# Patient Record
Sex: Male | Born: 1960 | Race: White | Hispanic: No | Marital: Married | Smoking: Former smoker
Health system: Southern US, Community
[De-identification: ages and names within clinical notes are randomized; demographics above are authoritative.]

## PROBLEM LIST (undated history)

## (undated) DIAGNOSIS — I10 Essential (primary) hypertension: Secondary | ICD-10-CM

## (undated) DIAGNOSIS — R972 Elevated prostate specific antigen [PSA]: Secondary | ICD-10-CM

## (undated) DIAGNOSIS — C801 Malignant (primary) neoplasm, unspecified: Secondary | ICD-10-CM

## (undated) DIAGNOSIS — E785 Hyperlipidemia, unspecified: Secondary | ICD-10-CM

## (undated) DIAGNOSIS — K219 Gastro-esophageal reflux disease without esophagitis: Secondary | ICD-10-CM

## (undated) DIAGNOSIS — F1010 Alcohol abuse, uncomplicated: Secondary | ICD-10-CM

## (undated) DIAGNOSIS — Z87442 Personal history of urinary calculi: Secondary | ICD-10-CM

## (undated) DIAGNOSIS — R0600 Dyspnea, unspecified: Secondary | ICD-10-CM

## (undated) HISTORY — DX: Essential (primary) hypertension: I10

## (undated) HISTORY — DX: Elevated prostate specific antigen (PSA): R97.20

## (undated) HISTORY — DX: Hyperlipidemia, unspecified: E78.5

## (undated) HISTORY — DX: Personal history of urinary calculi: Z87.442

---

## 2006-05-04 ENCOUNTER — Emergency Department: Payer: Self-pay | Admitting: Emergency Medicine

## 2009-12-17 ENCOUNTER — Ambulatory Visit: Payer: Self-pay | Admitting: Gastroenterology

## 2012-02-06 ENCOUNTER — Ambulatory Visit: Payer: Self-pay | Admitting: Emergency Medicine

## 2013-09-05 DIAGNOSIS — R972 Elevated prostate specific antigen [PSA]: Secondary | ICD-10-CM | POA: Insufficient documentation

## 2013-09-05 DIAGNOSIS — E785 Hyperlipidemia, unspecified: Secondary | ICD-10-CM | POA: Insufficient documentation

## 2014-02-03 ENCOUNTER — Encounter: Payer: Self-pay | Admitting: Podiatry

## 2014-02-03 ENCOUNTER — Ambulatory Visit (INDEPENDENT_AMBULATORY_CARE_PROVIDER_SITE_OTHER): Payer: BC Managed Care – PPO

## 2014-02-03 ENCOUNTER — Ambulatory Visit (INDEPENDENT_AMBULATORY_CARE_PROVIDER_SITE_OTHER): Payer: BC Managed Care – PPO | Admitting: Podiatry

## 2014-02-03 VITALS — BP 137/86 | HR 75 | Resp 16 | Ht 66.0 in | Wt 210.0 lb

## 2014-02-03 DIAGNOSIS — B351 Tinea unguium: Secondary | ICD-10-CM

## 2014-02-03 DIAGNOSIS — M722 Plantar fascial fibromatosis: Secondary | ICD-10-CM

## 2014-02-03 MED ORDER — DICLOFENAC SODIUM 75 MG PO TBEC: 75.0000 mg | DELAYED_RELEASE_TABLET | Freq: Two times a day (BID) | ORAL | Status: DC

## 2014-02-03 MED ORDER — TRIAMCINOLONE ACETONIDE 10 MG/ML IJ SUSP
10.0000 mg | Freq: Once | INTRAMUSCULAR | Status: AC
  Administered 2014-02-03: 10 mg

## 2014-02-03 NOTE — Progress Notes (Signed)
   Subjective:    Patient ID: Ray Saunders, male    DOB: [birth year], 53 y.o.   MRN: [id number]  HPI Comments: My left heel hurts. Its been hurting for 2 months. It has remained the same. It hurts when i first start walking on it. i dont do anything for my foot.  Foot Pain      Review of Systems  All other systems reviewed and are negative.      Objective:   Physical Exam        Assessment & Plan:

## 2014-02-03 NOTE — Patient Instructions (Signed)

## 2014-02-04 NOTE — Progress Notes (Signed)
Subjective:     Patient ID: Ray Saunders, male   DOB: 03-04-1960, 53 y.o.   MRN: [id number]  HPI patient presents stating I am having pain in my left heel for the last several months and it seems to be worse when I get up or after sitting and now is starting to hurt me all the time   Review of Systems  All other systems reviewed and are negative.      Objective:   Physical Exam  Constitutional: He is oriented to person, place, and time.  Cardiovascular: Intact distal pulses.   Musculoskeletal: Normal range of motion.  Neurological: He is oriented to person, place, and time.  Skin: Skin is warm.  Nursing note and vitals reviewed.  neurovascular status intact with muscle strength adequate and range of motion subtalar and midtarsal joint within normal limits. Patient is noted to have pain in the plantar heel left at the insertional point of the tendon into the calcaneus with fluid buildup and is noted to have good digital perfusion and is well oriented 3     Assessment:     Plantar fasciitis left at the insertional point of the tendon into the calcaneus with fluid buildup noted    Plan:     H&P and x-rays reviewed and today I injected the plantar heel 3 mg Kenalog 5 mg Xylocaine and applied fascial brace with instructions on usage. Placed on diclofenac 75 mg twice a day gave instructions on physical therapy and shoe gear modifications and reappoint in 10 days

## 2014-02-14 ENCOUNTER — Ambulatory Visit (INDEPENDENT_AMBULATORY_CARE_PROVIDER_SITE_OTHER): Payer: BC Managed Care – PPO | Admitting: Podiatry

## 2014-02-14 ENCOUNTER — Encounter: Payer: Self-pay | Admitting: Podiatry

## 2014-02-14 VITALS — BP 132/98 | HR 76 | Resp 18

## 2014-02-14 DIAGNOSIS — M722 Plantar fascial fibromatosis: Secondary | ICD-10-CM

## 2014-02-15 NOTE — Progress Notes (Signed)
Subjective:     Patient ID: LANGDON CROSSON, male   DOB: Feb 16, 1961, 53 y.o.   MRN: [id number]  HPI patient states I'm having quite a bit of reduce discomfort in my plantar fascia with pain still upon deep palpation but for the most part it has improved quite a bit   Review of Systems     Objective:   Physical Exam Neurovascular status intact with muscle strength adequate and noted to have significant diminishment of plantar pain with reduction of the arch noted upon weightbearing and moderate equinus condition    Assessment:     Plantar fasciitis improved with depression of the arch noted and biomechanical dysfunction    Plan:     Reviewed condition and today I advised on physical therapy supportive shoe and I did scan for custom orthotics to reduce stress against the plantar fascia. Patient will be seen back when these are ready

## 2014-03-10 ENCOUNTER — Ambulatory Visit (INDEPENDENT_AMBULATORY_CARE_PROVIDER_SITE_OTHER): Payer: BLUE CROSS/BLUE SHIELD | Admitting: *Deleted

## 2014-03-10 DIAGNOSIS — M722 Plantar fascial fibromatosis: Secondary | ICD-10-CM

## 2014-03-10 NOTE — Progress Notes (Signed)
Orthotics dispensed. Instructions given. Recheck progress in 1 mo.

## 2014-03-10 NOTE — Patient Instructions (Signed)

## 2014-04-11 ENCOUNTER — Ambulatory Visit: Payer: BLUE CROSS/BLUE SHIELD

## 2014-08-21 ENCOUNTER — Ambulatory Visit: Payer: Self-pay

## 2014-08-24 ENCOUNTER — Ambulatory Visit (INDEPENDENT_AMBULATORY_CARE_PROVIDER_SITE_OTHER): Payer: BLUE CROSS/BLUE SHIELD | Admitting: Urology

## 2014-08-24 VITALS — BP 170/92 | HR 82 | Resp 18 | Ht 65.0 in | Wt 221.2 lb

## 2014-08-24 DIAGNOSIS — I10 Essential (primary) hypertension: Secondary | ICD-10-CM | POA: Insufficient documentation

## 2014-08-24 DIAGNOSIS — R972 Elevated prostate specific antigen [PSA]: Secondary | ICD-10-CM

## 2014-08-24 LAB — BLADDER SCAN AMB NON-IMAGING

## 2014-08-24 NOTE — Addendum Note (Signed)
Addended by: Bettye Boeck on: 08/24/2014 04:49 PM   Modules accepted: Orders

## 2014-08-24 NOTE — Progress Notes (Signed)
I have been asked to see the patient by  [facility] family practice, for evaluation and management of elevated PSA.  History of present illness: Patient was found to have an elevated PSA (rapid rise) which was drawn as part of a prostate cancer screening.  He has no family history of prostate cancer.  The patient denies any bone pain, new back pain, or lower extremity edema.  The patient denies any changes in his voiding symptoms over the last 6 months.  Specifically he denies dysuria or hematuria. The patient has had elevated PSAs in the past and been treated with antibody is which has made his PSA returned back to normal.  PSA History: 1.25-> 2.27 (10 months)  IPSS: 6, QO L2 SHIM: Patient is able to get and maintain erections through the completion of intercourse.   Review of systems: A 12 point comprehensive review of systems was obtained and is negative unless otherwise stated in the history of present illness.  There are no active problems to display for this patient.   Current Outpatient Prescriptions on File Prior to Visit  Medication Sig Dispense Refill  . diclofenac (VOLTAREN) 75 MG EC tablet Take 1 tablet (75 mg total) by mouth 2 (two) times daily. 50 tablet 2   No current facility-administered medications on file prior to visit.    No past medical history on file.  No past surgical history on file.  History  Substance Use Topics  . Smoking status: Never Smoker   . Smokeless tobacco: Not on file  . Alcohol Use: No    No family history on file.  PE: There were no vitals filed for this visit. Patient appears to be in no acute distress  patient is alert and oriented x3 Atraumatic normocephalic head No cervical or supraclavicular lymphadenopathy appreciated No increased work of breathing, no audible wheezes/rhonchi Regular sinus rhythm/rate Abdomen is soft, nontender, nondistended, no CVA or suprapubic tenderness Rectal exam reveals a +1 prostate which is smooth,  symmetric without nodularity Lower extremities are symmetric without appreciable edema Grossly neurologically intact No identifiable skin lesions  No results for input(s): WBC, HGB, HCT in the last 72 hours. No results for input(s): NA, K, CL, CO2, GLUCOSE, BUN, CREATININE, CALCIUM in the last 72 hours. No results for input(s): LABPT, INR in the last 72 hours. No results for input(s): LABURIN in the last 72 hours. Results for orders placed or performed in visit on 02/06/12  Influenza A&B Antigens [facility])     Status: None   Collection Time: 02/06/12  8:33 AM  Result Value Ref Range Status   Micro Text Report   Final       COMMENT                   POSITIVE FOR INFLUENZA A (ANTIGEN PRESENT)   COMMENT                   NEGATIVE FOR INFLUENZA B (ANTIGEN ABSENT)   ANTIBIOTIC                                                        Imaging: none  Imp: The patient has a mildly elevated PSA with a rapid rise over the past 10 months. Recommendations: I discussed the implications of an elevated PSA with the patient. However  the reasons for elevated PSA including an enlarged prostate, recent ejaculation, prostate trauma, and infection/inflammation. The patient's PSA today is only slightly elevated, as such, recommended that we repeat the PSA in 6 months.  Cc: [facility] [place], [place]

## 2014-08-25 LAB — URINALYSIS, COMPLETE
Bilirubin, UA: NEGATIVE
Glucose, UA: NEGATIVE
Ketones, UA: NEGATIVE
Leukocytes, UA: NEGATIVE
Nitrite, UA: NEGATIVE
Protein, UA: NEGATIVE
RBC, UA: NEGATIVE
Specific Gravity, UA: 1.025 (ref 1.005–1.030)
Urobilinogen, Ur: 0.2 mg/dL (ref 0.2–1.0)
pH, UA: 6 (ref 5.0–7.5)

## 2014-08-25 LAB — MICROSCOPIC EXAMINATION: Bacteria, UA: NONE SEEN

## 2014-10-06 ENCOUNTER — Encounter: Payer: Self-pay | Admitting: *Deleted

## 2014-10-09 ENCOUNTER — Ambulatory Visit: Payer: BLUE CROSS/BLUE SHIELD | Admitting: Anesthesiology

## 2014-10-09 ENCOUNTER — Encounter
Admission: RE | Disposition: A | Discharge status: Home / Self Care | Payer: Self-pay | Source: Ambulatory Visit | Attending: Gastroenterology

## 2014-10-09 ENCOUNTER — Ambulatory Visit
Admission: RE | Admit: 2014-10-09 | Discharge: 2014-10-09 | Disposition: A | Discharge status: Home / Self Care | Payer: BLUE CROSS/BLUE SHIELD | Source: Ambulatory Visit | Attending: Gastroenterology | Admitting: Gastroenterology

## 2014-10-09 ENCOUNTER — Encounter: Payer: Self-pay | Admitting: *Deleted

## 2014-10-09 DIAGNOSIS — Z87891 Personal history of nicotine dependence: Secondary | ICD-10-CM | POA: Insufficient documentation

## 2014-10-09 DIAGNOSIS — I10 Essential (primary) hypertension: Secondary | ICD-10-CM | POA: Insufficient documentation

## 2014-10-09 DIAGNOSIS — D120 Benign neoplasm of cecum: Secondary | ICD-10-CM | POA: Insufficient documentation

## 2014-10-09 DIAGNOSIS — K219 Gastro-esophageal reflux disease without esophagitis: Secondary | ICD-10-CM | POA: Insufficient documentation

## 2014-10-09 DIAGNOSIS — D123 Benign neoplasm of transverse colon: Secondary | ICD-10-CM | POA: Insufficient documentation

## 2014-10-09 DIAGNOSIS — Z86010 Personal history of colonic polyps: Secondary | ICD-10-CM | POA: Insufficient documentation

## 2014-10-09 HISTORY — DX: Gastro-esophageal reflux disease without esophagitis: K21.9

## 2014-10-09 HISTORY — PX: COLONOSCOPY WITH PROPOFOL: SHX5780

## 2014-10-09 HISTORY — DX: Alcohol abuse, uncomplicated: F10.10

## 2014-10-09 SURGERY — COLONOSCOPY WITH PROPOFOL
Anesthesia: General

## 2014-10-09 MED ORDER — SODIUM CHLORIDE 0.9 % IV SOLN
INTRAVENOUS | Status: DC
  Administered 2014-10-09 – 2014-10-10 (×2): via INTRAVENOUS

## 2014-10-09 MED ORDER — LIDOCAINE HCL (CARDIAC) 20 MG/ML IV SOLN
INTRAVENOUS | Status: DC | PRN
  Administered 2014-10-09: 30 mg via INTRAVENOUS

## 2014-10-09 MED ORDER — PROPOFOL INFUSION 10 MG/ML OPTIME
INTRAVENOUS | Status: DC | PRN
  Administered 2014-10-09: 140 ug/kg/min via INTRAVENOUS

## 2014-10-09 MED ORDER — PROPOFOL 10 MG/ML IV BOLUS
INTRAVENOUS | Status: DC | PRN
  Administered 2014-10-09: 50 mg via INTRAVENOUS

## 2014-10-09 MED ORDER — SODIUM CHLORIDE 0.9 % IV SOLN: INTRAVENOUS | Status: DC

## 2014-10-09 MED ORDER — MIDAZOLAM HCL 5 MG/5ML IJ SOLN
INTRAMUSCULAR | Status: DC | PRN
  Administered 2014-10-09: 1 mg via INTRAVENOUS

## 2014-10-09 MED ORDER — FENTANYL CITRATE (PF) 100 MCG/2ML IJ SOLN
INTRAMUSCULAR | Status: DC | PRN
  Administered 2014-10-09: 50 ug via INTRAVENOUS

## 2014-10-09 NOTE — Op Note (Signed)
[facility] Gastroenterology Patient Name: Ray Saunders Procedure Date: 10/09/2014 11:04 AM MRN: [id number] Account #: [id number] Date of Birth: [birth year] Admit Type: Outpatient Age: 54 Room: [facility] ENDO ROOM 3 Gender: Male Note Status: Finalized Procedure:         Colonoscopy Indications:       Personal history of colonic polyps Providers:         Lollie Sails, MD Referring MD:      Sofie Hartigan (Referring MD) Medicines:         Monitored Anesthesia Care Complications:     No immediate complications. Procedure:         Pre-Anesthesia Assessment:                    - ASA Grade Assessment: III - A patient with severe                     systemic disease.                    After obtaining informed consent, the colonoscope was                     passed under direct vision. Throughout the procedure, the                     patient's blood pressure, pulse, and oxygen saturations                     were monitored continuously. The Colonoscope was                     introduced through the anus and advanced to the the cecum,                     identified by appendiceal orifice and ileocecal valve. The                     colonoscopy was performed without difficulty. The patient                     tolerated the procedure well. The quality of the bowel                     preparation was good. Findings:      A 2 mm polyp was found in the cecum. The polyp was sessile. The polyp       was removed with a cold biopsy forceps. Resection and retrieval were       complete.      A 1 mm polyp was found in the proximal transverse colon. The polyp was       sessile. The polyp was removed with a cold biopsy forceps. Resection and       retrieval were complete.      The retroflexed view of the distal rectum and anal verge was normal and       showed no anal or rectal abnormalities.      The exam was otherwise without abnormality.      The digital rectal exam was  normal. Impression:        - One 2 mm polyp in the cecum. Resected and retrieved.                    - One 1 mm polyp in the  proximal transverse colon.                     Resected and retrieved.                    - The distal rectum and anal verge are normal on                     retroflexion view.                    - The examination was otherwise normal. Recommendation:    - Await pathology results.                    - Telephone GI clinic for pathology results in 1 week. Procedure Code(s): --- Professional ---                    [phone], Colonoscopy, flexible; with biopsy, single or                     multiple Diagnosis Code(s): --- Professional ---                    211.3, Benign neoplasm of colon                    V12.72, Personal history of colonic polyps CPT copyright 2014 American Medical Association. All rights reserved. The codes documented in this report are preliminary and upon coder review may  be revised to meet current compliance requirements. Lollie Sails, MD 10/09/2014 11:34:34 AM This report has been signed electronically. Number of Addenda: 0 Note Initiated On: 10/09/2014 11:04 AM Scope Withdrawal Time: 0 hours 9 minutes 50 seconds  Total Procedure Duration: 0 hours 19 minutes 14 seconds       [facility]

## 2014-10-09 NOTE — Anesthesia Postprocedure Evaluation (Signed)
  Anesthesia Post-op Note  Patient: Ray Saunders  Procedure(s) Performed: Procedure(s): COLONOSCOPY WITH PROPOFOL (N/A)  Anesthesia type:General  Patient location: PACU  Post pain: Pain level controlled  Post assessment: Post-op Vital signs reviewed, Patient's Cardiovascular Status Stable, Respiratory Function Stable, Patent Airway and No signs of Nausea or vomiting  Post vital signs: Reviewed and stable  Last Vitals:  Filed Vitals:   10/09/14 1210  BP: 108/66  Pulse: 67  Temp:   Resp: 13    Level of consciousness: awake, alert  and patient cooperative  Complications: No apparent anesthesia complications

## 2014-10-09 NOTE — H&P (Signed)
Outpatient short stay form Pre-procedure 10/09/2014 10:56 AM Lollie Sails MD  Primary Physician: Dr. deal Feldpausch  Reason for visit:  Colonoscopy  History of present illness:  Patient is a 54 year old male with a personal history of adenomatous colon polyps. His last colonoscopy was in 2011. He tolerated his prep well. Takes no aspirin or anticoagulation medications.    Current facility-administered medications:  .  0.9 %  sodium chloride infusion, , Intravenous, Continuous, Lollie Sails, MD .  0.9 %  sodium chloride infusion, , Intravenous, Continuous, Lollie Sails, MD  Prescriptions prior to admission  Medication Sig Dispense Refill Last Dose  . aspirin EC 81 MG tablet Take 1 tablet by mouth daily.   10/08/2014 at Unknown time  . lisinopril (PRINIVIL,ZESTRIL) 10 MG tablet Take 1 tablet by mouth daily.     [name] lisinopril (PRINIVIL,ZESTRIL) 20 MG tablet Take 20 mg by mouth daily.   10/08/2014 at Unknown time  . diclofenac (VOLTAREN) 75 MG EC tablet Take 1 tablet (75 mg total) by mouth 2 (two) times daily. (Patient not taking: Reported on 08/24/2014) 50 tablet 2 Not Taking     No Known Allergies   Past Medical History  Diagnosis Date  . Hypertension   . Elevated PSA   . Alcohol abuse   . GERD (gastroesophageal reflux disease)   . Elevated PSA     Review of systems:      Physical Exam    Heart and lungs: Regular rate and rhythm without rub or gallop, lungs are bilaterally clear    HEENT: Normocephalic atraumatic eyes are anicteric    Other:     Pertinant exam for procedure: Soft nontender nondistended bowel sounds positive normoactive    Planned proceedures: Colonoscopy and indicated procedures I have discussed the risks benefits and complications of procedures to include not limited to bleeding, infection, perforation and the risk of sedation and the patient wishes to proceed.    Lollie Sails, MD Gastroenterology 10/09/2014  10:56  AM

## 2014-10-09 NOTE — Transfer of Care (Signed)
Immediate Anesthesia Transfer of Care Note  Patient: Ray Saunders  Procedure(s) Performed: Procedure(s): COLONOSCOPY WITH PROPOFOL (N/A)  Patient Location: PACU and Short Stay  Anesthesia Type:General  Level of Consciousness: awake, oriented and patient cooperative  Airway & Oxygen Therapy: Patient Spontanous Breathing and Patient connected to face mask oxygen  Post-op Assessment: Report given to RN and Post -op Vital signs reviewed and stable  Post vital signs: Reviewed and stable  Last Vitals:  Filed Vitals:   10/09/14 1145  BP: 104/65  Pulse: 71  Temp: 36.4 C  Resp:     Complications: No apparent anesthesia complications

## 2014-10-09 NOTE — Anesthesia Preprocedure Evaluation (Signed)
Anesthesia Evaluation  Patient identified by MRN, date of birth, ID band Patient awake    Reviewed: Allergy & Precautions, NPO status , Patient's Chart, lab work & pertinent test results  Airway Mallampati: II       Dental  (+) Partial Upper   Pulmonary neg pulmonary ROS, former smoker,    Pulmonary exam normal       Cardiovascular hypertension, Pt. on medications Normal cardiovascular exam    Neuro/Psych    GI/Hepatic Neg liver ROS, GERD-  ,  Endo/Other  negative endocrine ROS  Renal/GU negative Renal ROS     Musculoskeletal negative musculoskeletal ROS (+)   Abdominal (+) + obese,   Peds negative pediatric ROS (+)  Hematology negative hematology ROS (+)   Anesthesia Other Findings   Reproductive/Obstetrics negative OB ROS                             Anesthesia Physical Anesthesia Plan  ASA: III  Anesthesia Plan: General   Post-op Pain Management:    Induction: Intravenous  Airway Management Planned: Nasal Cannula  Additional Equipment:   Intra-op Plan:   Post-operative Plan:   Informed Consent: I have reviewed the patients History and Physical, chart, labs and discussed the procedure including the risks, benefits and alternatives for the proposed anesthesia with the patient or authorized representative who has indicated his/her understanding and acceptance.     Plan Discussed with: CRNA  Anesthesia Plan Comments:         Anesthesia Quick Evaluation

## 2014-10-10 ENCOUNTER — Encounter: Payer: Self-pay | Admitting: Gastroenterology

## 2014-10-11 LAB — SURGICAL PATHOLOGY

## 2014-10-12 ENCOUNTER — Emergency Department: Payer: BLUE CROSS/BLUE SHIELD

## 2014-10-12 ENCOUNTER — Emergency Department
Admission: EM | Admit: 2014-10-12 | Discharge: 2014-10-12 | Disposition: A | Discharge status: Home / Self Care | Payer: BLUE CROSS/BLUE SHIELD | Attending: Emergency Medicine | Admitting: Emergency Medicine

## 2014-10-12 DIAGNOSIS — I10 Essential (primary) hypertension: Secondary | ICD-10-CM | POA: Insufficient documentation

## 2014-10-12 DIAGNOSIS — R109 Unspecified abdominal pain: Secondary | ICD-10-CM | POA: Diagnosis present

## 2014-10-12 DIAGNOSIS — Z7982 Long term (current) use of aspirin: Secondary | ICD-10-CM | POA: Insufficient documentation

## 2014-10-12 DIAGNOSIS — Z79899 Other long term (current) drug therapy: Secondary | ICD-10-CM | POA: Diagnosis not present

## 2014-10-12 DIAGNOSIS — Z87891 Personal history of nicotine dependence: Secondary | ICD-10-CM | POA: Insufficient documentation

## 2014-10-12 DIAGNOSIS — N201 Calculus of ureter: Secondary | ICD-10-CM | POA: Diagnosis not present

## 2014-10-12 LAB — CBC WITH DIFFERENTIAL/PLATELET
BASOS ABS: 0.1 10*3/uL (ref 0–0.1)
BASOS PCT: 1 %
Eosinophils Absolute: 0.3 10*3/uL (ref 0–0.7)
Eosinophils Relative: 4 %
HEMATOCRIT: 46.3 % (ref 40.0–52.0)
HEMOGLOBIN: 15.6 g/dL (ref 13.0–18.0)
Lymphocytes Relative: 43 %
Lymphs Abs: 3.6 10*3/uL (ref 1.0–3.6)
MCH: 30 pg (ref 26.0–34.0)
MCHC: 33.7 g/dL (ref 32.0–36.0)
MCV: 88.8 fL (ref 80.0–100.0)
Monocytes Absolute: 0.8 10*3/uL (ref 0.2–1.0)
Monocytes Relative: 9 %
NEUTROS ABS: 3.6 10*3/uL (ref 1.4–6.5)
NEUTROS PCT: 43 %
Platelets: 231 10*3/uL (ref 150–440)
RBC: 5.21 MIL/uL (ref 4.40–5.90)
RDW: 13.5 % (ref 11.5–14.5)
WBC: 8.4 10*3/uL (ref 3.8–10.6)

## 2014-10-12 LAB — URINALYSIS COMPLETE WITH MICROSCOPIC (ARMC ONLY)
Bilirubin Urine: NEGATIVE
Glucose, UA: NEGATIVE mg/dL
HGB URINE DIPSTICK: NEGATIVE
KETONES UR: NEGATIVE mg/dL
LEUKOCYTES UA: NEGATIVE
NITRITE: NEGATIVE
PROTEIN: NEGATIVE mg/dL
SPECIFIC GRAVITY, URINE: 1.024 (ref 1.005–1.030)
pH: 6 (ref 5.0–8.0)

## 2014-10-12 LAB — COMPREHENSIVE METABOLIC PANEL
ALK PHOS: 58 U/L (ref 38–126)
ALT: 48 U/L (ref 17–63)
ANION GAP: 8 (ref 5–15)
AST: 37 U/L (ref 15–41)
Albumin: 4.4 g/dL (ref 3.5–5.0)
BILIRUBIN TOTAL: 0.6 mg/dL (ref 0.3–1.2)
BUN: 15 mg/dL (ref 6–20)
CO2: 24 mmol/L (ref 22–32)
Calcium: 9.3 mg/dL (ref 8.9–10.3)
Chloride: 110 mmol/L (ref 101–111)
Creatinine, Ser: 1.37 mg/dL — ABNORMAL HIGH (ref 0.61–1.24)
GFR, EST NON AFRICAN AMERICAN: 57 mL/min — AB (ref >60)
Glucose, Bld: 123 mg/dL — ABNORMAL HIGH (ref 65–99)
Potassium: 3.5 mmol/L (ref 3.5–5.1)
SODIUM: 142 mmol/L (ref 135–145)
TOTAL PROTEIN: 7.5 g/dL (ref 6.5–8.1)

## 2014-10-12 MED ORDER — HYDROMORPHONE HCL 1 MG/ML IJ SOLN
1.0000 mg | Freq: Once | INTRAMUSCULAR | Status: AC
  Administered 2014-10-12: 1 mg via INTRAVENOUS
  Filled 2014-10-12: qty 1

## 2014-10-12 MED ORDER — OXYCODONE-ACETAMINOPHEN 5-325 MG PO TABS: 1.0000 | ORAL_TABLET | Freq: Four times a day (QID) | ORAL | Status: DC | PRN

## 2014-10-12 MED ORDER — TAMSULOSIN HCL 0.4 MG PO CAPS: 0.4000 mg | ORAL_CAPSULE | Freq: Every day | ORAL | Status: DC

## 2014-10-12 MED ORDER — ONDANSETRON HCL 4 MG/2ML IJ SOLN
4.0000 mg | Freq: Once | INTRAMUSCULAR | Status: AC
  Administered 2014-10-12: 4 mg via INTRAVENOUS
  Filled 2014-10-12: qty 2

## 2014-10-12 MED ORDER — KETOROLAC TROMETHAMINE 30 MG/ML IJ SOLN
30.0000 mg | Freq: Once | INTRAMUSCULAR | Status: AC
  Administered 2014-10-12: 30 mg via INTRAVENOUS
  Filled 2014-10-12: qty 1

## 2014-10-12 MED ORDER — SODIUM CHLORIDE 0.9 % IV BOLUS (SEPSIS)
1000.0000 mL | Freq: Once | INTRAVENOUS | Status: AC
  Administered 2014-10-12: 1000 mL via INTRAVENOUS

## 2014-10-12 NOTE — ED Notes (Signed)
Patient transported to CT 

## 2014-10-12 NOTE — ED Notes (Signed)
Pt still diaphoric still hurting iv pain meds didn't help MD aware. Pt still having dry heaves.

## 2014-10-12 NOTE — ED Provider Notes (Addendum)
[facility] Emergency Department Provider Note  Time seen: 5:18 AM  I have reviewed the triage vital signs and the nursing notes.   HISTORY  Chief Complaint Flank Pain    HPI Ray Saunders is a 54 y.o. male with a past medical history of hypertension presents the emergency department left flank pain. According to the patient he woke up to urinate. Upon urinating he developed sharp severe left flank pain. Patient states the pain is a 10/10. Has a history of one kidney stone approximately 6 years ago. Denies fever, dysuria, diarrhea, vomiting, but does state nausea. Denies chest pain, trouble breathing.No modifying factors identified.     Past Medical History  Diagnosis Date  . Hypertension   . Elevated PSA   . Alcohol abuse   . GERD (gastroesophageal reflux disease)   . Elevated PSA     Patient Active Problem List   Diagnosis Date Noted  . BP (high blood pressure) 08/24/2014  . Abnormal prostate specific antigen 09/05/2013  . HLD (hyperlipidemia) 09/05/2013    Past Surgical History  Procedure Laterality Date  . No surgical history    . Colonoscopy with propofol N/A 10/09/2014    Procedure: COLONOSCOPY WITH PROPOFOL;  Surgeon: Lollie Sails, MD;  Location: [facility] ENDOSCOPY;  Service: Endoscopy;  Laterality: N/A;    Current Outpatient Rx  Name  Route  Sig  Dispense  Refill  . aspirin EC 81 MG tablet   Oral   Take 1 tablet by mouth daily.         [name] lisinopril (PRINIVIL,ZESTRIL) 20 MG tablet   Oral   Take 20 mg by mouth daily.         . diclofenac (VOLTAREN) 75 MG EC tablet   Oral   Take 1 tablet (75 mg total) by mouth 2 (two) times daily. Patient not taking: Reported on 08/24/2014   50 tablet   2     Allergies Review of patient's allergies indicates no known allergies.  Family History  Problem Relation Age of Onset  . Kidney cancer Other   . Kidney cancer Mother     Social History Social History  Substance Use Topics   . Smoking status: Former Smoker    Types: Cigarettes    Quit date: 08/23/1989  . Smokeless tobacco: Not on file  . Alcohol Use: No    Review of Systems Constitutional: Negative for fever. Cardiovascular: Negative for chest pain. Respiratory: Negative for shortness of breath. Gastrointestinal: Positive for left flank pain and nausea. Negative for vomiting or diarrhea. Genitourinary: Negative for dysuria. Negative for hematuria. Musculoskeletal: Positive for left back pain. 10-point ROS otherwise negative.  ____________________________________________   PHYSICAL EXAM:  VITAL SIGNS: ED Triage Vitals  Enc Vitals Group     BP 10/12/14 0508 154/96 mmHg     Pulse Rate 10/12/14 0507 73     Resp 10/12/14 0507 18     Temp 10/12/14 0507 98.3 F (36.8 C)     Temp Source 10/12/14 0507 Oral     SpO2 10/12/14 0507 94 %     Weight 10/12/14 0507 210 lb (95.255 kg)     Height 10/12/14 0507 5\' 6"  (1.676 m)     Head Cir --      Peak Flow --      Pain Score 10/12/14 0508 10     Pain Loc --      Pain Edu? --      Excl. in [place]? --  Constitutional: Alert and oriented. Well appearing and in no distress. Eyes: Normal exam ENT   Head: Normocephalic and atraumatic Cardiovascular: Normal rate, regular rhythm. No murmurs, rubs, or gallops. Respiratory: Normal respiratory effort without tachypnea nor retractions. Breath sounds are clear Gastrointestinal: Soft and nontender. No distention.  There is no CVA tenderness. Musculoskeletal: Nontender with normal range of motion in all extremities.  Neurologic:  Normal speech and language. No gross focal neurologic deficits  Skin:  Skin is warm, dry and intact.  Psychiatric: Mood and affect are normal. Speech and behavior are normal. ____________________________________________   RADIOLOGY  CT consistent with ureterolithiasis.  ____________________________________________   INITIAL IMPRESSION / ASSESSMENT AND PLAN / ED  COURSE  Pertinent labs & imaging results that were available during my care of the patient were reviewed by me and considered in my medical decision making (see chart for details).  Patient with acute onset of left flank pain. We'll check labs, CT renal scan, and treat pain and nausea. Exam most consistent with ureterolithiasis.  CT consistent with ureterolithiasis with 2 stones, one of which measuring 6 mm. I discussed with the patient the need to follow-up with urology, he will call the number provided to make an appointment as soon as possible. We will continue to monitor the patient in the emergency department, currently the patient states his pain is a 2/10 down from a 10/10. Patient care signed out to Dr. Edd Fabian. Discussed incidental pulmonary nodule finding with patient and the need to follow up for CT scan in 1 year. Patient agreeable. ____________________________________________   FINAL CLINICAL IMPRESSION(S) / ED DIAGNOSES  Ureterolithiasis Left flank pain   Harvest Dark, MD 10/12/14 [id number]  Harvest Dark, MD 10/12/14 0700

## 2014-10-12 NOTE — ED Notes (Signed)
MD at bedside. 

## 2014-10-12 NOTE — ED Notes (Signed)
Pt in with co left flank pian hx of kidney  Stones.

## 2014-10-12 NOTE — ED Notes (Addendum)
Pt c/o of left flank pain radiates to left testicle. Pain 10/10 pt is clammy and diaphoric. Severe nausea and dry heaves.

## 2014-10-12 NOTE — Discharge Instructions (Signed)

## 2014-10-17 ENCOUNTER — Ambulatory Visit (INDEPENDENT_AMBULATORY_CARE_PROVIDER_SITE_OTHER): Payer: BLUE CROSS/BLUE SHIELD | Admitting: Urology

## 2014-10-17 ENCOUNTER — Encounter: Payer: Self-pay | Admitting: Urology

## 2014-10-17 VITALS — BP 135/80 | HR 66 | Ht 66.0 in | Wt 215.5 lb

## 2014-10-17 DIAGNOSIS — N132 Hydronephrosis with renal and ureteral calculous obstruction: Secondary | ICD-10-CM

## 2014-10-17 DIAGNOSIS — R972 Elevated prostate specific antigen [PSA]: Secondary | ICD-10-CM

## 2014-10-17 DIAGNOSIS — N201 Calculus of ureter: Secondary | ICD-10-CM

## 2014-10-17 DIAGNOSIS — R312 Other microscopic hematuria: Secondary | ICD-10-CM | POA: Diagnosis not present

## 2014-10-17 DIAGNOSIS — R3129 Other microscopic hematuria: Secondary | ICD-10-CM | POA: Insufficient documentation

## 2014-10-17 LAB — URINALYSIS, COMPLETE
Bilirubin, UA: NEGATIVE
GLUCOSE, UA: NEGATIVE
KETONES UA: NEGATIVE
Nitrite, UA: NEGATIVE
Urobilinogen, Ur: 0.2 mg/dL (ref 0.2–1.0)
pH, UA: 5 (ref 5.0–7.5)

## 2014-10-17 LAB — MICROSCOPIC EXAMINATION: WBC, UA: >30 /hpf — ABNORMAL HIGH (ref 0–?)

## 2014-10-17 NOTE — Progress Notes (Signed)
10/17/2014 8:47 AM   Ray Saunders 1960-06-18 [id number]  Referring provider: Sofie Hartigan, MD [place], [place] [address]  Chief Complaint  Patient presents with  . Nephrolithiasis    patient seen in ER    HPI: Ray Saunders is a 54 -year-old white male with a history of nephrolithiasis who presents today after being seen in the emergency room on August 18 for intense left-sided flank pain.   Noncontrast CT performed at that time noted 2 separate distal ureteral stones, the largest measuring 6 mm. He was discharged with oxycodone/APAP and tamsulosin and instructed to follow-up with urology.  Over the next several days, he experienced intense left-sided flank pain on and off. He described the pain as feeling like a knife stabbing him in the back. The pain will last for 45 minutes to an hour. He would soak in a hot tub of water to help ease the pain. He did not find anything that made the pain worse. He denied any fevers, chills, nausea or vomiting. He did not experience gross hematuria.  He believes he may have passed the stone early this a.m. His pain became increasingly worse throughout the night and then as of this morning he has been pain free. He did not capture any stone fragments noted he see any stone fragments in the toilet.  His UA today is positive for microscopic hematuria with greater than 30 RBC's seen per high-power field.  *side note-patient had recently seen Dr. Louis Meckel for an elevated PSA and is to return in 6 months for follow-up  PMH: Past Medical History  Diagnosis Date  . Hypertension   . Elevated PSA   . Alcohol abuse   . GERD (gastroesophageal reflux disease)   . Elevated PSA   . History of kidney stones     Surgical History: Past Surgical History  Procedure Laterality Date  . Colonoscopy with propofol N/A 10/09/2014    Procedure: COLONOSCOPY WITH PROPOFOL;  Surgeon: Lollie Sails, MD;  Location: [facility] ENDOSCOPY;  Service: Endoscopy;   Laterality: N/A;    Home Medications:    Medication List       This list is accurate as of: 10/17/14  8:47 AM.  Always use your most recent med list.               aspirin EC 81 MG tablet  Take 1 tablet by mouth daily.     diclofenac 75 MG EC tablet  Commonly known as:  VOLTAREN  Take 1 tablet (75 mg total) by mouth 2 (two) times daily.     lisinopril 20 MG tablet  Commonly known as:  PRINIVIL,ZESTRIL  Take 20 mg by mouth daily.     oxyCODONE-acetaminophen 5-325 MG per tablet  Commonly known as:  ROXICET  Take 1 tablet by mouth every 6 (six) hours as needed.     tamsulosin 0.4 MG Caps capsule  Commonly known as:  FLOMAX  Take 1 capsule (0.4 mg total) by mouth daily.        Allergies: No Known Allergies  Family History: Family History  Problem Relation Age of Onset  . Kidney failure Mother     transplant  . Prostate cancer Neg Hx     Social History:  reports that he quit smoking about 25 years ago. His smoking use included Cigarettes. He does not have any smokeless tobacco history on file. He reports that he drinks alcohol. He reports that he does not use illicit  drugs.  ROS: UROLOGY Frequent Urination?: No Hard to postpone urination?: No Burning/pain with urination?: No Get up at night to urinate?: Yes Leakage of urine?: No Urine stream starts and stops?: Yes Trouble starting stream?: No Do you have to strain to urinate?: No Blood in urine?: No Urinary tract infection?: No Sexually transmitted disease?: No Injury to kidneys or bladder?: No Painful intercourse?: No Weak stream?: Yes Erection problems?: Yes Penile pain?: No  Gastrointestinal Nausea?: No Vomiting?: No Indigestion/heartburn?: No Diarrhea?: No Constipation?: No  Constitutional Fever: No Night sweats?: No Weight loss?: No Fatigue?: Yes  Skin Skin rash/lesions?: No Itching?: No  Eyes Blurred vision?: No Double vision?: No  Ears/Nose/Throat Sore throat?: No Sinus  problems?: No  Hematologic/Lymphatic Swollen glands?: No Easy bruising?: No  Cardiovascular Leg swelling?: No Chest pain?: No  Respiratory Cough?: Yes Shortness of breath?: Yes  Endocrine Excessive thirst?: No  Musculoskeletal Back pain?: Yes Joint pain?: No  Neurological Headaches?: No Dizziness?: No  Psychologic Depression?: No Anxiety?: No  Physical Exam: BP 135/80 mmHg  Pulse 66  Ht 5\' 6"  (1.676 m)  Wt 215 lb 8 oz (97.75 kg)  BMI 34.80 kg/m2   Laboratory Data: Results for orders placed or performed in visit on 10/17/14  Microscopic Examination  Result Value Ref Range   WBC, UA >30 (H) 0 -  5 /hpf   RBC, UA >30 (H) 0 -  2 /hpf   Epithelial Cells (non renal) 0-10 0 - 10 /hpf   Mucus, UA Present (A) Not Estab.   Bacteria, UA Many (A) None seen/Few  Urinalysis, Complete  Result Value Ref Range   Specific Gravity, UA >1.030 (H) 1.005 - 1.030   pH, UA 5.0 5.0 - 7.5   Color, UA Yellow Yellow   Appearance Ur Cloudy (A) Clear   Leukocytes, UA 1+ (A) Negative   Protein, UA 3+ (A) Negative/Trace   Glucose, UA Negative Negative   Ketones, UA Negative Negative   RBC, UA 3+ (A) Negative   Bilirubin, UA Negative Negative   Urobilinogen, Ur 0.2 0.2 - 1.0 mg/dL   Nitrite, UA Negative Negative   Microscopic Examination See below:     Lab Results  Component Value Date   WBC 8.4 10/12/2014   HGB 15.6 10/12/2014   HCT 46.3 10/12/2014   MCV 88.8 10/12/2014   PLT 231 10/12/2014    Lab Results  Component Value Date   CREATININE 1.37* 10/12/2014   PSA History:      1.25 ng/mL on 09/05/2013      3.35 ng/mL on 04/04/2013      2.27 ng/mL on 07/19/2014  Urinalysis    Component Value Date/Time   COLORURINE YELLOW* 10/12/2014 0531   APPEARANCEUR CLEAR* 10/12/2014 0531   LABSPEC 1.024 10/12/2014 0531   PHURINE 6.0 10/12/2014 0531   GLUCOSEU NEGATIVE 10/12/2014 0531   HGBUR NEGATIVE 10/12/2014 0531   BILIRUBINUR NEGATIVE 10/12/2014 0531   BILIRUBINUR  Negative 08/24/2014 [place] 10/12/2014 0531   PROTEINUR NEGATIVE 10/12/2014 0531   NITRITE NEGATIVE 10/12/2014 0531   NITRITE Negative 08/24/2014 1514   LEUKOCYTESUR NEGATIVE 10/12/2014 0531   LEUKOCYTESUR Negative 08/24/2014 1514    Pertinent Imaging: CLINICAL DATA: Left flank pain and history of kidney stones.  EXAM: CT ABDOMEN AND PELVIS WITHOUT CONTRAST  TECHNIQUE: Multidetector CT imaging of the abdomen and pelvis was performed following the standard protocol without IV contrast.  COMPARISON: None currently available  FINDINGS: BODY WALL: No contributory findings.  LOWER CHEST: 5  mm pulmonary nodule in the left lower lobe on image 18. Unfortunately, 05/04/2006 abdominal CT comparison is not available.  Coronary atherosclerotic calcification.  ABDOMEN/PELVIS:  Liver: Patchy hepatic steatosis.  Biliary: No evidence of biliary obstruction or stone.  Pancreas: Unremarkable.  Spleen: Unremarkable.  Adrenals: Unremarkable.  Kidneys and ureters: Mild left hydroureteronephrosis secondary to 2 distal left ureteral calculi measuring 4 mm proximally and 6 by 3 mm distally. Multiple right renal calculi without hydronephrosis or right ureteral calculus.  Bladder: Unremarkable.  Reproductive: Dystrophic calcifications in the prostate gland. Asymmetric appearance of the seminal vesicles is best explained by more vertical orientation of the right vesicle. The seminal vesicles have overall symmetric volume. Bilateral hydrocele, partly visible.  Bowel: No obstruction. No inflammatory changes.  Retroperitoneum: No mass or adenopathy.  Peritoneum: No ascites or pneumoperitoneum.  Vascular: No acute abnormality.  OSSEOUS: No acute abnormalities.  IMPRESSION: 1. Two stones in the distal left ureter with mild obstruction. The calculi measure 4 mm and 6 x 3 mm. 2. Right nephrolithiasis. 3. 5 mm left lower lobe pulmonary nodule.  Unfortunately, a CT from 05/04/2006 is not available for review and a followup noncontrast chest CT in 6 to 12 months is recommended in this patient with smoking history. 4. Hepatic steatosis and coronary atherosclerosis  Electronically Signed: By: Monte Fantasia M.D. On: 10/12/2014 06:05  Assessment & Plan:    1. Left ureteral stones:    Patient was found to have 2 stones in his distal left ureter on noncontrast CT performed on 10/12/2014. He believes his past the stones as his pain had abated early this morning. He did not capture any fragments. I will obtain a KUB and renal ultrasound to evaluate his current stone status.  He will return for report. If stones are not visible hydronephrosis has resolved, we will obtain a 24-hour urine for metabolic workup.  - Urinalysis, Complete   2. Hydronephrosis:    Patient believes he may have passed his to left distal ureteral stones. We will obtain a renal ultrasound to ensure the hydronephrosis has resolved.  3. Microscopic hematuria:   Patient had greater than 30 RBC's per high-power field on today's urinalysis. This is in the presence of ureteral stones. We will continue to monitor his urinalyses to ensure the microscopic hematuria resolves.  4. Rising PSA:     Patient's PSA has risen from 1.25-2.27 and a 10 month time period.   He will return in 6 months time for follow-up DRE and PSA.    No Follow-up on file.  Ray Saunders, [facility] [address], [place], [place] [address] [phone]

## 2014-10-24 ENCOUNTER — Ambulatory Visit: Payer: BLUE CROSS/BLUE SHIELD | Admitting: Obstetrics and Gynecology

## 2015-02-12 ENCOUNTER — Ambulatory Visit (INDEPENDENT_AMBULATORY_CARE_PROVIDER_SITE_OTHER): Payer: BLUE CROSS/BLUE SHIELD | Admitting: Obstetrics and Gynecology

## 2015-02-12 ENCOUNTER — Telehealth: Payer: Self-pay | Admitting: Obstetrics and Gynecology

## 2015-02-12 ENCOUNTER — Ambulatory Visit: Payer: BLUE CROSS/BLUE SHIELD | Admitting: Urology

## 2015-02-12 ENCOUNTER — Encounter: Payer: Self-pay | Admitting: Obstetrics and Gynecology

## 2015-02-12 VITALS — BP 157/89 | HR 77 | Resp 16 | Ht 66.0 in | Wt 221.3 lb

## 2015-02-12 DIAGNOSIS — R972 Elevated prostate specific antigen [PSA]: Secondary | ICD-10-CM

## 2015-02-12 LAB — MICROSCOPIC EXAMINATION
BACTERIA UA: NONE SEEN
EPITHELIAL CELLS (NON RENAL): NONE SEEN /HPF (ref 0–10)

## 2015-02-12 LAB — URINALYSIS, COMPLETE
Bilirubin, UA: NEGATIVE
Glucose, UA: NEGATIVE
KETONES UA: NEGATIVE
LEUKOCYTES UA: NEGATIVE
NITRITE UA: NEGATIVE
PH UA: 7 (ref 5.0–7.5)
Protein, UA: NEGATIVE
RBC UA: NEGATIVE
SPEC GRAV UA: 1.02 (ref 1.005–1.030)
Urobilinogen, Ur: 0.2 mg/dL (ref 0.2–1.0)

## 2015-02-12 NOTE — Telephone Encounter (Signed)
Please notify patient that he just needs to call scheduling at [phone] to schedule his renal ultrasound and KUB. Heaney's to tell them that he needs to have them both done at the same time. I will call him with results. Thanks

## 2015-02-12 NOTE — Progress Notes (Signed)
9:34 AM   Ray Saunders Jul 20, 1960 [id number]  Referring provider: Sofie Hartigan, MD [place], [place] [address]  Chief Complaint  Patient presents with  . Elevated PSA    HPI: Ray Saunders is a 54 -year-old white male with a history of nephrolithiasis who was previously seen in our office for a rising PSA and nephrolithiasis.  Noncontrast CT performed in August noted  2 separate distal ureteral stones, the largest measuring 6 mm. He was discharged with oxycodone/APAP and tamsulosin and instructed to follow-up with urology.  Patient presented to our office for follow-up visit. At that time his pain had resolved and it was believed that he had passed his stones. Renal ultrasound and KUB was ordered for confirmation and the patient did not have these imaging studies performed because he did not feel that they were needed because his pain had completely resolved. Microscopic hematuria was also noted at his last visit. Patient denies any further flank pain. No gross hematuria or fevers.  He presents today for recheck on his previously noted rising PSAs. He reports that he has been feeling well and denies any urinary symptoms.  PMH: Past Medical History  Diagnosis Date  . Hypertension   . Elevated PSA   . Alcohol abuse   . GERD (gastroesophageal reflux disease)   . Elevated PSA   . History of kidney stones   . HLD (hyperlipidemia)     Surgical History: Past Surgical History  Procedure Laterality Date  . Colonoscopy with propofol N/A 10/09/2014    Procedure: COLONOSCOPY WITH PROPOFOL;  Surgeon: Lollie Sails, MD;  Location: [facility] ENDOSCOPY;  Service: Endoscopy;  Laterality: N/A;    Home Medications:    Medication List       This list is accurate as of: 02/12/15  9:34 AM.  Always use your most recent med list.               diclofenac 75 MG EC tablet  Commonly known as:  VOLTAREN  Take 1 tablet (75 mg total) by  mouth 2 (two) times daily.     lisinopril 20 MG tablet  Commonly known as:  PRINIVIL,ZESTRIL  Take 20 mg by mouth daily.        Allergies: No Known Allergies  Family History: Family History  Problem Relation Age of Onset  . Kidney failure Mother     transplant  . Prostate cancer Neg Hx     Social History:  reports that he quit smoking about 25 years ago. His smoking use included Cigarettes. He does not have any smokeless tobacco history on file. He reports that he drinks alcohol. He reports that he does not use illicit drugs.  ROS: UROLOGY Frequent Urination?: No Hard to postpone urination?: No Burning/pain with urination?: No Get up at night to urinate?: Yes Leakage of urine?: No Urine stream starts and stops?: No Trouble starting stream?: No Do you have to strain to urinate?: No Blood in urine?: No Urinary tract infection?: No Sexually transmitted disease?: No Injury to kidneys or bladder?: No Painful intercourse?: No Weak stream?: No Erection problems?: No Penile pain?: No  Gastrointestinal Nausea?: No Vomiting?: No Indigestion/heartburn?: No Diarrhea?: No Constipation?: No  Constitutional Fever: No Night sweats?: No Weight loss?: No Fatigue?: No  Skin Skin rash/lesions?: No Itching?: No  Eyes Blurred vision?: No Double vision?: No  Ears/Nose/Throat Sore throat?: Yes Sinus problems?: No  Hematologic/Lymphatic Swollen glands?: No Easy  bruising?: No  Cardiovascular Leg swelling?: No Chest pain?: No  Respiratory Cough?: Yes Shortness of breath?: No  Endocrine Excessive thirst?: No  Musculoskeletal Back pain?: Yes Joint pain?: No  Neurological Headaches?: No Dizziness?: No  Psychologic Depression?: No Anxiety?: No  Physical Exam: BP 157/89 mmHg  Pulse 77  Resp 16  Ht 5\' 6"  (1.676 m)  Wt 221 lb 4.8 oz (100.381 kg)  BMI 35.74 kg/m2   PE: Patient appears to be in no acute distress  patient is alert and oriented  x3 Atraumatic normocephalic head No CVA or suprapubic tenderness Rectal exam reveals a +1 prostate which is smooth, symmetric without nodularity Lower extremities are symmetric without appreciable edema Grossly neurologically intact No identifiable skin lesions   Laboratory Data: Results for orders placed or performed in visit on 10/17/14  Microscopic Examination  Result Value Ref Range   WBC, UA >30 (H) 0 -  5 /hpf   RBC, UA >30 (H) 0 -  2 /hpf   Epithelial Cells (non renal) 0-10 0 - 10 /hpf   Mucus, UA Present (A) Not Estab.   Bacteria, UA Many (A) None seen/Few  Urinalysis, Complete  Result Value Ref Range   Specific Gravity, UA >1.030 (H) 1.005 - 1.030   pH, UA 5.0 5.0 - 7.5   Color, UA Yellow Yellow   Appearance Ur Cloudy (A) Clear   Leukocytes, UA 1+ (A) Negative   Protein, UA 3+ (A) Negative/Trace   Glucose, UA Negative Negative   Ketones, UA Negative Negative   RBC, UA 3+ (A) Negative   Bilirubin, UA Negative Negative   Urobilinogen, Ur 0.2 0.2 - 1.0 mg/dL   Nitrite, UA Negative Negative   Microscopic Examination See below:     Lab Results  Component Value Date   WBC 8.4 10/12/2014   HGB 15.6 10/12/2014   HCT 46.3 10/12/2014   MCV 88.8 10/12/2014   PLT 231 10/12/2014    Lab Results  Component Value Date   CREATININE 1.37* 10/12/2014   PSA History:      1.25 ng/mL on 09/05/2013      3.35 ng/mL on 04/04/2013      2.27 ng/mL on 07/19/2014  Urinalysis    Component Value Date/Time   COLORURINE YELLOW* 10/12/2014 0531   APPEARANCEUR CLEAR* 10/12/2014 0531   LABSPEC 1.024 10/12/2014 0531   PHURINE 6.0 10/12/2014 0531   GLUCOSEU Negative 10/17/2014 0829   HGBUR NEGATIVE 10/12/2014 0531   BILIRUBINUR Negative 10/17/2014 0829   BILIRUBINUR NEGATIVE 10/12/2014 0531   KETONESUR NEGATIVE 10/12/2014 0531   PROTEINUR NEGATIVE 10/12/2014 0531   NITRITE Negative 10/17/2014 0829   NITRITE NEGATIVE 10/12/2014 0531   LEUKOCYTESUR 1+* 10/17/2014 0829    LEUKOCYTESUR NEGATIVE 10/12/2014 0531    Pertinent Imaging: CLINICAL DATA: Left flank pain and history of kidney stones.  EXAM: CT ABDOMEN AND PELVIS WITHOUT CONTRAST  TECHNIQUE: Multidetector CT imaging of the abdomen and pelvis was performed following the standard protocol without IV contrast.  COMPARISON: None currently available  FINDINGS: BODY WALL: No contributory findings.  LOWER CHEST: 5 mm pulmonary nodule in the left lower lobe on image 18. Unfortunately, 05/04/2006 abdominal CT comparison is not available.  Coronary atherosclerotic calcification.  ABDOMEN/PELVIS:  Liver: Patchy hepatic steatosis.  Biliary: No evidence of biliary obstruction or stone.  Pancreas: Unremarkable.  Spleen: Unremarkable.  Adrenals: Unremarkable.  Kidneys and ureters: Mild left hydroureteronephrosis secondary to 2 distal left ureteral calculi measuring 4 mm proximally and 6 by 3 mm distally. Multiple  right renal calculi without hydronephrosis or right ureteral calculus.  Bladder: Unremarkable.  Reproductive: Dystrophic calcifications in the prostate gland. Asymmetric appearance of the seminal vesicles is best explained by more vertical orientation of the right vesicle. The seminal vesicles have overall symmetric volume. Bilateral hydrocele, partly visible.  Bowel: No obstruction. No inflammatory changes.  Retroperitoneum: No mass or adenopathy.  Peritoneum: No ascites or pneumoperitoneum.  Vascular: No acute abnormality.  OSSEOUS: No acute abnormalities.  IMPRESSION: 1. Two stones in the distal left ureter with mild obstruction. The calculi measure 4 mm and 6 x 3 mm. 2. Right nephrolithiasis. 3. 5 mm left lower lobe pulmonary nodule. Unfortunately, a CT from 05/04/2006 is not available for review and a followup noncontrast chest CT in 6 to 12 months is recommended in this patient with smoking history. 4. Hepatic steatosis and coronary  atherosclerosis  Electronically Signed: By: Monte Fantasia M.D. On: 10/12/2014 06:05  Assessment & Plan:    1. Left ureteral stones:    Patient was found to have 2 stones in his distal left ureter on noncontrast CT performed on 10/12/2014. Patient states that he believes that he passed his stones prior to his last visit and his symptoms have completely resolved. The renal ultrasound and KUB will be was ordered at that time the patient states that he did not go get the imaging studies done because he did not feel that they were really needed. I explained to patient that it is his decision whether or not to have the ultrasound and KUB performed though it would be reassuring if he had the ultrasound done and it showed resolution of previous hydronephrosis. - Urinalysis, Complete   2. Hydronephrosis:    Patient believes he may have passed his to left distal ureteral stones. He did not obtain his renal ultrasound and KUB as previously ordered. We will reorder studies today and patient states that he will go have them performed. We will call him the results.  3. Microscopic hematuria:  Resolved. UA negative today.  Most likely due to previous stone passage.   4. Rising PSA:     Patient's PSA has risen from 1.25-2.27 and a 10 month time period.   DRE unchanged.  PSA drawn today. 1.25 ng/mL on 09/05/2013 3.35 ng/mL on 04/04/2013 2.27 ng/mL on 07/19/2014  Return in about 6 months (around 08/13/2015) for DRE/PSA.  Herbert Moors, [facility] [address] [place], [place] [address] [phone]

## 2015-02-13 ENCOUNTER — Telehealth: Payer: Self-pay

## 2015-02-13 LAB — PSA: PROSTATE SPECIFIC AG, SERUM: 2 ng/mL (ref 0.0–4.0)

## 2015-02-13 NOTE — Telephone Encounter (Signed)
-----   Message from Roda Shutters, [place] sent at 02/13/2015  8:41 AM EST ----- Please notify patient that his PSA has remained stable. I would like to check it once again in 6 once and if it remains unchanged we should be able to go to yearly checks. Thanks

## 2015-02-13 NOTE — Telephone Encounter (Signed)
LMOM

## 2015-02-14 NOTE — Telephone Encounter (Signed)
Spoke with pt in reference to PSA results. Pt voiced understanding.  

## 2015-03-21 ENCOUNTER — Encounter: Payer: Self-pay | Admitting: Podiatry

## 2015-03-21 ENCOUNTER — Ambulatory Visit (INDEPENDENT_AMBULATORY_CARE_PROVIDER_SITE_OTHER): Payer: BLUE CROSS/BLUE SHIELD | Admitting: Podiatry

## 2015-03-21 ENCOUNTER — Ambulatory Visit: Payer: Self-pay

## 2015-03-21 DIAGNOSIS — B070 Plantar wart: Secondary | ICD-10-CM

## 2015-03-21 DIAGNOSIS — B079 Viral wart, unspecified: Secondary | ICD-10-CM

## 2015-03-21 DIAGNOSIS — M79672 Pain in left foot: Secondary | ICD-10-CM

## 2015-03-21 DIAGNOSIS — B078 Other viral warts: Secondary | ICD-10-CM

## 2015-03-22 NOTE — Progress Notes (Signed)
Subjective:     Patient ID: Ray Saunders, male   DOB: [birth year], 55 y.o.   MRN: [id number]  HPI patient states I have a spot in the bottom my left foot that's very painful and makes it hard for me to walk comfortably. States that it started last couple months and he's not sure if he has stepped on something   Review of Systems  All other systems reviewed and are negative.      Objective:   Physical Exam  Constitutional: He is oriented to person, place, and time.  Cardiovascular: Intact distal pulses.   Musculoskeletal: Normal range of motion.  Neurological: He is oriented to person, place, and time.  Skin: Skin is warm.  Nursing note and vitals reviewed.  neurovascular status intact muscle strength adequate range of motion within normal limits with patient found to have keratotic lesion sub-third metatarsal left that upon debridement shows pinpoint bleeding pain to lateral pressure. Patient has history warts on his right knee and also has history of trauma to his feet     Assessment:      possibility for foreign body versus verruca plantaris    Plan:      H&P and x-rays reviewed with patient. Today were to focus on this is a soft tissue lesion given no indications of metal in the foot and I debrided the lesion applied chemical agent to create an immune response and applied sterile dressing with padding. Reappoint to recheck

## 2015-03-28 NOTE — Telephone Encounter (Signed)
The patient was contacted several times by the scheduling dept but the patient refused to have the ultrasound done.   Thanks,  Sharyn Lull

## 2015-04-18 ENCOUNTER — Encounter: Payer: Self-pay | Admitting: Podiatry

## 2015-04-18 ENCOUNTER — Ambulatory Visit (INDEPENDENT_AMBULATORY_CARE_PROVIDER_SITE_OTHER): Payer: BLUE CROSS/BLUE SHIELD | Admitting: Podiatry

## 2015-04-18 VITALS — BP 143/87 | HR 69 | Resp 16

## 2015-04-18 DIAGNOSIS — B070 Plantar wart: Secondary | ICD-10-CM

## 2015-04-18 DIAGNOSIS — B078 Other viral warts: Secondary | ICD-10-CM

## 2015-04-18 DIAGNOSIS — B079 Viral wart, unspecified: Secondary | ICD-10-CM

## 2015-04-19 NOTE — Progress Notes (Signed)
Subjective:     Patient ID: Ray Saunders, male   DOB: 10/18/1960, 55 y.o.   MRN: [id number]  HPI patient presents stating the lesion is present but there is some improvement   Review of Systems     Objective:   Physical Exam Neurovascular status unchanged with keratotic lesion left plantar foot that upon debridement shows pinpoint bleeding but is thinner than previous    Assessment:     Improving from wart left    Plan:     Debrided tissue applied immune agent and sterile dressing and reappoint to recheck

## 2015-08-13 ENCOUNTER — Encounter: Payer: Self-pay | Admitting: Urology

## 2015-08-13 ENCOUNTER — Ambulatory Visit (INDEPENDENT_AMBULATORY_CARE_PROVIDER_SITE_OTHER): Payer: BLUE CROSS/BLUE SHIELD | Admitting: Urology

## 2015-08-13 VITALS — BP 134/83 | HR 71 | Ht 66.0 in | Wt 218.9 lb

## 2015-08-13 DIAGNOSIS — R3129 Other microscopic hematuria: Secondary | ICD-10-CM | POA: Diagnosis not present

## 2015-08-13 DIAGNOSIS — N132 Hydronephrosis with renal and ureteral calculous obstruction: Secondary | ICD-10-CM

## 2015-08-13 DIAGNOSIS — N138 Other obstructive and reflux uropathy: Secondary | ICD-10-CM

## 2015-08-13 DIAGNOSIS — R972 Elevated prostate specific antigen [PSA]: Secondary | ICD-10-CM

## 2015-08-13 DIAGNOSIS — N201 Calculus of ureter: Secondary | ICD-10-CM | POA: Diagnosis not present

## 2015-08-13 DIAGNOSIS — N401 Enlarged prostate with lower urinary tract symptoms: Secondary | ICD-10-CM

## 2015-08-13 NOTE — Progress Notes (Signed)
8:39 AM   Ray Saunders June 06, 1960 [id number]  Referring provider: Sofie Hartigan, MD [address] [place], [place] [address]  Chief Complaint  Patient presents with  . Elevated PSA    6 month follow up    HPI: Patient is a 55 year old Caucasian male who was previously seen in our office for a rising PSA and nephrolithiasis.    History of nephrolithiasis Noncontrast CT performed in August 2016 noted 2 separate distal ureteral stones, the largest measuring 6 mm.  He was discharged with oxycodone/APAP and tamsulosin and instructed to follow-up with urology.  Patient presented to our office for follow-up visit. At that time his pain had resolved and it was believed that he had passed his stones. Renal ultrasound and KUB was ordered for confirmation and the patient did not have these imaging studies performed because he did not feel that they were needed because his pain had completely resolved.  Patient denies any further flank pain. No gross hematuria or fevers. UA from 01/2015 noted 0-2 RBC's/hpf.    Rising PSA Patient's PSA has risen from 1.25-2.27 and a 10 month time period.  He presents today for recheck on his previously noted rising PSAs. He reports that he has been feeling well and denies any urinary symptoms.  He has no family history of prostate cancer.  IPSS score is 8/1.        IPSS      08/13/15 0800       International Prostate Symptom Score   How often have you had the sensation of not emptying your bladder? Less than 1 in 5     How often have you had to urinate less than every two hours? Less than 1 in 5 times     How often have you found you stopped and started again several times when you urinated? Less than half the time     How often have you found it difficult to postpone urination? Not at All     How often have you had a weak urinary stream? About half the time     How often have you had to strain to start  urination? Not at All     How many times did you typically get up at night to urinate? 1 Time     Total IPSS Score 8     Quality of Life due to urinary symptoms   If you were to spend the rest of your life with your urinary condition just the way it is now how would you feel about that? Pleased        Score:  1-7 Mild 8-19 Moderate 20-35 Severe    PMH: Past Medical History  Diagnosis Date  . Hypertension   . Elevated PSA   . Alcohol abuse   . GERD (gastroesophageal reflux disease)   . Elevated PSA   . History of kidney stones   . HLD (hyperlipidemia)     Surgical History: Past Surgical History  Procedure Laterality Date  . Colonoscopy with propofol N/A 10/09/2014    Procedure: COLONOSCOPY WITH PROPOFOL;  Surgeon: Lollie Sails, MD;  Location: [facility] ENDOSCOPY;  Service: Endoscopy;  Laterality: N/A;    Home Medications:    Medication List       This list is accurate as of: 08/13/15  8:39 AM.  Always use your most recent med list.  lisinopril 20 MG tablet  Commonly known as:  PRINIVIL,ZESTRIL  Take 20 mg by mouth daily.        Allergies: No Known Allergies  Family History: Family History  Problem Relation Age of Onset  . Kidney failure Mother     transplant  . Prostate cancer Neg Hx     Social History:  reports that he quit smoking about 25 years ago. His smoking use included Cigarettes. He does not have any smokeless tobacco history on file. He reports that he drinks alcohol. He reports that he does not use illicit drugs.  ROS: UROLOGY Frequent Urination?: No Hard to postpone urination?: No Burning/pain with urination?: No Get up at night to urinate?: Yes Leakage of urine?: No Urine stream starts and stops?: Yes Trouble starting stream?: No Do you have to strain to urinate?: No Blood in urine?: No Urinary tract infection?: No Sexually transmitted disease?: No Injury to kidneys or bladder?: No Painful intercourse?: No Weak  stream?: Yes Erection problems?: No Penile pain?: No  Gastrointestinal Nausea?: No Vomiting?: No Indigestion/heartburn?: No Diarrhea?: No Constipation?: No  Constitutional Fever: No Night sweats?: No Weight loss?: No Fatigue?: No  Skin Skin rash/lesions?: No Itching?: No  Eyes Blurred vision?: No Double vision?: No  Ears/Nose/Throat Sore throat?: No Sinus problems?: No  Hematologic/Lymphatic Swollen glands?: No Easy bruising?: No  Cardiovascular Leg swelling?: No Chest pain?: No  Respiratory Cough?: No Shortness of breath?: No  Endocrine Excessive thirst?: No  Musculoskeletal Back pain?: No Joint pain?: No  Neurological Headaches?: No Dizziness?: No  Psychologic Depression?: No Anxiety?: No  Physical Exam: BP 134/83 mmHg  Pulse 71  Ht 5\' 6"  (1.676 m)  Wt 218 lb 14.4 oz (99.292 kg)  BMI 35.35 kg/m2  Constitutional: Well nourished. Alert and oriented, No acute distress. HEENT: [place] AT, moist mucus membranes. Trachea midline, no masses. Cardiovascular: No clubbing, cyanosis, or edema. Respiratory: Normal respiratory effort, no increased work of breathing. GI: Abdomen is soft, non tender, non distended, no abdominal masses. Liver and spleen not palpable.  No hernias appreciated.  Stool sample for occult testing is not indicated.   GU: No CVA tenderness.  No bladder fullness or masses.  Patient with circumcised phallus.  Urethral meatus is patent.  No penile discharge. No penile lesions or rashes. Scrotum without lesions, cysts, rashes and/or edema.  Testicles are located scrotally bilaterally. No masses are appreciated in the testicles. Left and right epididymis are normal. Rectal: Patient with  normal sphincter tone. Anus and perineum without scarring or rashes. No rectal masses are appreciated. Prostate is approximately 55 grams, no nodules are appreciated. Seminal vesicles are normal. Skin: No rashes, bruises or suspicious lesions. Lymph: No  cervical or inguinal adenopathy. Neurologic: Grossly intact, no focal deficits, moving all 4 extremities. Psychiatric: Normal mood and affect.  Laboratory Data:  Lab Results  Component Value Date   WBC 8.4 10/12/2014   HGB 15.6 10/12/2014   HCT 46.3 10/12/2014   MCV 88.8 10/12/2014   PLT 231 10/12/2014    Lab Results  Component Value Date   CREATININE 1.37* 10/12/2014   PSA History:      1.25 ng/mL on 09/05/2013      3.35 ng/mL on 04/04/2013      2.27 ng/mL on 07/19/2014   Assessment & Plan:    1. Left ureteral stones:    Patient was found to have 2 stones in his distal left ureter on noncontrast CT performed on 10/12/2014. Patient states that he believes that  he passed his stones prior to his last visit and his symptoms have completely resolved. The renal ultrasound and KUB will be was ordered at that time the patient states that he did not go get the imaging studies done because he did not feel that they were really needed. I explained to patient that it is his decision whether or not to have the ultrasound and KUB performed though it would be reassuring if he had the ultrasound done and it showed resolution of previous hydronephrosis.  I did advise him of silent hydronephrosis due to an obstructing phenomenon that no longer causes pain, but it continues to cause damage to the kidney. He voiced his understanding and does not want to schedule a renal ultrasound at this time.   2. Hydronephrosis:    See above.  3. Microscopic hematuria:  Resolved.   4. Rising PSA:     Patient's PSA has risen from 1.25-2.27 and a 10 month time period.   DRE unchanged.  Recent PSA is 2.27.  PSA is drawn today.  If stable, he will RTC in 6 months for PSA and exam.    5. BPH with LUTS:   IPSS 8/1.  He will continue to manage conservatively.  We will continue to monitor.  RTC in 6 months for IPSS, exam and PSA if PSA is stable.    Return for pending labs.  Zara Council, [facility] [address], [place], [place] [address] [phone]

## 2015-08-14 LAB — PSA: Prostate Specific Ag, Serum: 2.2 ng/mL (ref 0.0–4.0)

## 2015-08-15 ENCOUNTER — Telehealth: Payer: Self-pay

## 2015-08-15 NOTE — Telephone Encounter (Signed)
LMOM

## 2015-08-15 NOTE — Telephone Encounter (Signed)
-----   Message from Nori Riis, PA-C sent at 08/14/2015  8:15 AM EDT ----- Patient's PSA is stable.  We will need to see him again in 6 months.

## 2015-08-15 NOTE — Telephone Encounter (Signed)
Spoke with pt in reference to PSA results. Made aware will need to be seen again in 38mo. Pt voiced understanding. Pt was transferred to the front to make f/u appt.

## 2016-02-14 ENCOUNTER — Ambulatory Visit: Payer: BLUE CROSS/BLUE SHIELD | Admitting: Urology

## 2016-02-14 ENCOUNTER — Encounter: Payer: Self-pay | Admitting: Urology

## 2016-02-14 VITALS — BP 138/76 | HR 72 | Ht 66.0 in | Wt 226.2 lb

## 2016-02-14 DIAGNOSIS — Z87442 Personal history of urinary calculi: Secondary | ICD-10-CM | POA: Diagnosis not present

## 2016-02-14 DIAGNOSIS — N138 Other obstructive and reflux uropathy: Secondary | ICD-10-CM

## 2016-02-14 DIAGNOSIS — R972 Elevated prostate specific antigen [PSA]: Secondary | ICD-10-CM | POA: Diagnosis not present

## 2016-02-14 DIAGNOSIS — N401 Enlarged prostate with lower urinary tract symptoms: Secondary | ICD-10-CM | POA: Diagnosis not present

## 2016-02-14 NOTE — Progress Notes (Signed)
8:47 AM   Ray Saunders 1960/12/05 [id number]  Referring provider: Sofie Hartigan, MD [address] [place], [place] [address]  Chief Complaint  Patient presents with  . Elevated PSA    6 month follow up  . Hydronephrosis    HPI: Patient is a 55 year old Caucasian male with a history of a rising PSA, BPH with LUTS and nephrolithiasis.    History of nephrolithiasis Noncontrast CT performed in August 2016 noted 2 separate distal ureteral stones, the largest measuring 6 mm.  Renal ultrasound and KUB was ordered for confirmation and the patient did not have these imaging studies performed because he did not feel that they were needed because his pain had completely resolved.  Patient denies any further flank pain. No gross hematuria or fevers.    Rising PSA Patient's PSA has risen from 1.25-2.27 and a 10 month time period.  His most recent PSA was 2.2 ng/mL on 08/13/2015.    BPH WITH LUTS His IPSS score today is 3, which is mild lower urinary tract symptomatology.  He is mostly satisfied with his quality life due to his urinary symptoms.  His previous I PSS score was 8/1.   His major complaints today are nocturia x 1 and weak stream.  He has had these symptoms for a few years.  He denies any dysuria, hematuria or suprapubic pain.  He also denies any recent fevers, chills, nausea or vomiting.  Maternal grandfather with prostate cancer.        IPSS    Row Name 02/14/16 0800         International Prostate Symptom Score   How often have you had the sensation of not emptying your bladder? Not at All     How often have you had to urinate less than every two hours? Not at All     How often have you found you stopped and started again several times when you urinated? Not at All     How often have you found it difficult to postpone urination? Not at All     How often have you had a weak urinary stream? Less than half the time     How often  have you had to strain to start urination? Not at All     How many times did you typically get up at night to urinate? 1 Time     Total IPSS Score 3       Quality of Life due to urinary symptoms   If you were to spend the rest of your life with your urinary condition just the way it is now how would you feel about that? Mostly Satisfied        Score:  1-7 Mild 8-19 Moderate 20-35 Severe    PMH: Past Medical History:  Diagnosis Date  . Alcohol abuse   . Elevated PSA   . Elevated PSA   . GERD (gastroesophageal reflux disease)   . History of kidney stones   . HLD (hyperlipidemia)   . Hypertension     Surgical History: Past Surgical History:  Procedure Laterality Date  . COLONOSCOPY WITH PROPOFOL N/A 10/09/2014   Procedure: COLONOSCOPY WITH PROPOFOL;  Surgeon: Lollie Sails, MD;  Location: [facility] ENDOSCOPY;  Service: Endoscopy;  Laterality: N/A;    Home Medications:  Allergies as of 02/14/2016   No Known Allergies     Medication List  Accurate as of 02/14/16  8:47 AM. Always use your most recent med list.          lisinopril 20 MG tablet Commonly known as:  PRINIVIL,ZESTRIL Take 20 mg by mouth daily.       Allergies: No Known Allergies  Family History: Family History  Problem Relation Age of Onset  . Kidney failure Mother     transplant  . Prostate cancer Maternal Grandfather   . Bladder Cancer Neg Hx     Social History:  reports that he quit smoking about 26 years ago. His smoking use included Cigarettes. He has never used smokeless tobacco. He reports that he drinks alcohol. He reports that he does not use drugs.  ROS: UROLOGY Frequent Urination?: No Hard to postpone urination?: No Burning/pain with urination?: No Get up at night to urinate?: Yes Leakage of urine?: No Urine stream starts and stops?: No Trouble starting stream?: No Do you have to strain to urinate?: No Blood in urine?: No Urinary tract infection?: No Sexually  transmitted disease?: No Injury to kidneys or bladder?: No Painful intercourse?: No Weak stream?: Yes Erection problems?: No Penile pain?: No  Gastrointestinal Nausea?: No Vomiting?: No Indigestion/heartburn?: No Diarrhea?: No Constipation?: No  Constitutional Fever: No Night sweats?: No Weight loss?: No Fatigue?: No  Skin Skin rash/lesions?: No Itching?: No  Eyes Blurred vision?: No Double vision?: No  Ears/Nose/Throat Sore throat?: No Sinus problems?: No  Hematologic/Lymphatic Swollen glands?: No Easy bruising?: No  Cardiovascular Leg swelling?: No Chest pain?: No  Respiratory Cough?: Yes Shortness of breath?: No  Endocrine Excessive thirst?: No  Musculoskeletal Back pain?: No Joint pain?: No  Neurological Headaches?: No Dizziness?: No  Psychologic Depression?: No Anxiety?: No  Physical Exam: BP 138/76   Pulse 72   Ht 5\' 6"  (1.676 m)   Wt 226 lb 3.2 oz (102.6 kg)   BMI 36.51 kg/m   Constitutional: Well nourished. Alert and oriented, No acute distress. HEENT: [place] AT, moist mucus membranes. Trachea midline, no masses. Cardiovascular: No clubbing, cyanosis, or edema. Respiratory: Normal respiratory effort, no increased work of breathing. GI: Abdomen is soft, non tender, non distended, no abdominal masses. Liver and spleen not palpable.  No hernias appreciated.  Stool sample for occult testing is not indicated.   GU: No CVA tenderness.  No bladder fullness or masses.  Patient with circumcised phallus.  Urethral meatus is patent.  No penile discharge. No penile lesions or rashes. Scrotum without lesions, cysts, rashes and/or edema.  Testicles are located scrotally bilaterally. No masses are appreciated in the testicles. Left and right epididymis are normal. Rectal: Patient with  normal sphincter tone. Anus and perineum without scarring or rashes. No rectal masses are appreciated. Prostate is approximately 55 grams, no nodules are appreciated.  Seminal vesicles are normal. Skin: No rashes, bruises or suspicious lesions. Lymph: No cervical or inguinal adenopathy. Neurologic: Grossly intact, no focal deficits, moving all 4 extremities. Psychiatric: Normal mood and affect.  Laboratory Data:  Lab Results  Component Value Date   WBC 8.4 10/12/2014   HGB 15.6 10/12/2014   HCT 46.3 10/12/2014   MCV 88.8 10/12/2014   PLT 231 10/12/2014    Lab Results  Component Value Date   CREATININE 1.37 (H) 10/12/2014   PSA History:       1.25 ng/mL on 09/05/2013       3.35 ng/mL on 04/04/2013     2.27 ng/mL on 07/19/2014  2.0 ng/mL on 02/12/2015  2.2 ng/mL on 08/13/2015  Assessment & Plan:    1. History of nephrolithiasis  - patient does not have symptoms at this time  - he does not want a KUB at this time  - Advised to contact our office or seek treatment in the ED if becomes febrile or pain/ vomiting are difficult control in order to arrange for emergent/urgent intervention    2. Rising PSA  - Patient's PSA has risen from 1.25-2.27 and a 10 month time period  - Recent PSA is 2.2  - PSA repeated today  3. BPH with LUTS  - IPSS score is 3/2, it is improving  - Continue conservative management, avoiding bladder irritants and timed voiding's  - RTC in 6 months for IPSS, PSA and exam    Return in about 6 months (around 08/14/2016) for IPSS, PSA and exam.  Zara Council, [facility]  [place] [address], [place],  [address] [phone]

## 2016-02-15 ENCOUNTER — Telehealth: Payer: Self-pay

## 2016-02-15 LAB — PSA: Prostate Specific Ag, Serum: 2.4 ng/mL (ref 0.0–4.0)

## 2016-02-15 NOTE — Telephone Encounter (Signed)
-----   Message from Nori Riis, PA-C sent at 02/15/2016  7:55 AM EST ----- Please let the patient know that his PSA is stable and we will see him in 6 months.

## 2016-02-15 NOTE — Telephone Encounter (Signed)
Spoke with pt in reference to PSA. Pt voiced understanding.  

## 2016-03-31 ENCOUNTER — Ambulatory Visit (INDEPENDENT_AMBULATORY_CARE_PROVIDER_SITE_OTHER): Payer: BLUE CROSS/BLUE SHIELD | Admitting: Urology

## 2016-03-31 ENCOUNTER — Encounter: Payer: Self-pay | Admitting: Urology

## 2016-03-31 VITALS — BP 161/79 | HR 80 | Ht 66.0 in | Wt 226.7 lb

## 2016-03-31 DIAGNOSIS — R3129 Other microscopic hematuria: Secondary | ICD-10-CM | POA: Diagnosis not present

## 2016-03-31 DIAGNOSIS — N200 Calculus of kidney: Secondary | ICD-10-CM

## 2016-03-31 DIAGNOSIS — R109 Unspecified abdominal pain: Secondary | ICD-10-CM

## 2016-03-31 LAB — URINALYSIS, COMPLETE
BILIRUBIN UA: NEGATIVE
GLUCOSE, UA: NEGATIVE
Ketones, UA: NEGATIVE
LEUKOCYTES UA: NEGATIVE
NITRITE UA: NEGATIVE
Protein, UA: NEGATIVE
SPEC GRAV UA: 1.01 (ref 1.005–1.030)
Urobilinogen, Ur: 0.2 mg/dL (ref 0.2–1.0)
pH, UA: 6 (ref 5.0–7.5)

## 2016-03-31 LAB — MICROSCOPIC EXAMINATION: Bacteria, UA: NONE SEEN

## 2016-03-31 MED ORDER — TRAMADOL HCL 50 MG PO TABS: 50.0000 mg | ORAL_TABLET | Freq: Four times a day (QID) | ORAL | 0 refills | Status: AC | PRN

## 2016-03-31 NOTE — Progress Notes (Signed)
2:41 PM   Ray Saunders Jul 24, 1960 [id number]  Referring provider: Sofie Hartigan, MD [place], [place] [address]  Chief Complaint  Patient presents with  . Nephrolithiasis    patient thinks he has a kidney stone    HPI: Patient is a 56 year old Caucasian male with a history of a rising PSA, BPH with LUTS and nephrolithiasis who presents today requesting an urgent appointment for right flank pain.    History of nephrolithiasis Noncontrast CT performed in August 2016 noted 2 separate distal ureteral stones, the largest measuring 6 mm.  Renal ultrasound and KUB was ordered for confirmation and the patient did not have these imaging studies performed because he did not feel that they were needed because his pain had completely resolved.  Patient denies any further flank pain. No gross hematuria or fevers.  Today, he states that he started to have right flank pain two days ago.  He sat in hot water and drinking lemon water that evening.  The pain abated until the next evening.  He repeated the hot water and drinking lemon water.  The pain abated again.  His pain returned again yesterday was he was seen at [facility] urgent care.  UA was positive for 4-10 RBC's.  The pain returned today and he presents for further evaluation and management.  He is also having nocturia, hesitancy, straining to urinate and a weak urinary stream.  His pain is an 8-9/10.    Rising PSA Patient's PSA has risen from 1.25-2.27 and a 10 month time period.  His most recent PSA was 2.2 ng/mL on 08/13/2015.    BPH WITH LUTS His IPSS score today is 3, which is mild lower urinary tract symptomatology.  He is mostly satisfied with his quality life due to his urinary symptoms.  His previous I PSS score was 8/1.   His major complaints today are nocturia x 1 and weak stream.  He has had these symptoms for a few years.  He denies any dysuria, hematuria or suprapubic pain.  He also  denies any recent fevers, chills, nausea or vomiting.  Maternal grandfather with prostate cancer.        IPSS    Row Name 02/14/16 0800         International Prostate Symptom Score   How often have you had the sensation of not emptying your bladder? Not at All     How often have you had to urinate less than every two hours? Not at All     How often have you found you stopped and started again several times when you urinated? Not at All     How often have you found it difficult to postpone urination? Not at All     How often have you had a weak urinary stream? Less than half the time     How often have you had to strain to start urination? Not at All     How many times did you typically get up at night to urinate? 1 Time     Total IPSS Score 3       Quality of Life due to urinary symptoms   If you were to spend the rest of your life with your urinary condition just the way it is now how would you feel about that? Mostly Satisfied        Score:  1-7 Mild 8-19 Moderate 20-35 Severe  PMH: Past Medical History:  Diagnosis Date  . Alcohol abuse   . Elevated PSA   . Elevated PSA   . GERD (gastroesophageal reflux disease)   . History of kidney stones   . HLD (hyperlipidemia)   . Hypertension     Surgical History: Past Surgical History:  Procedure Laterality Date  . COLONOSCOPY WITH PROPOFOL N/A 10/09/2014   Procedure: COLONOSCOPY WITH PROPOFOL;  Surgeon: Lollie Sails, MD;  Location: [facility] ENDOSCOPY;  Service: Endoscopy;  Laterality: N/A;    Home Medications:  Allergies as of 03/31/2016   No Known Allergies     Medication List       Accurate as of 03/31/16  2:41 PM. Always use your most recent med list.          ibuprofen 800 MG tablet Commonly known as:  ADVIL,MOTRIN Take by mouth.   lisinopril 20 MG tablet Commonly known as:  PRINIVIL,ZESTRIL Take 20 mg by mouth daily.   tamsulosin 0.4 MG Caps capsule Commonly known as:  FLOMAX Take by mouth.     traMADol 50 MG tablet Commonly known as:  ULTRAM Take by mouth.       Allergies: No Known Allergies  Family History: Family History  Problem Relation Age of Onset  . Kidney failure Mother     transplant  . Prostate cancer Maternal Grandfather   . Bladder Cancer Neg Hx     Social History:  reports that he quit smoking about 26 years ago. His smoking use included Cigarettes. He has never used smokeless tobacco. He reports that he does not drink alcohol or use drugs.  ROS: UROLOGY Frequent Urination?: No Hard to postpone urination?: No Burning/pain with urination?: No Get up at night to urinate?: Yes Leakage of urine?: No Urine stream starts and stops?: No Trouble starting stream?: Yes Do you have to strain to urinate?: Yes Blood in urine?: No Urinary tract infection?: No Sexually transmitted disease?: No Injury to kidneys or bladder?: No Painful intercourse?: No Weak stream?: Yes Erection problems?: No Penile pain?: No  Gastrointestinal Nausea?: No Vomiting?: No Indigestion/heartburn?: No Diarrhea?: No Constipation?: No  Constitutional Fever: No Night sweats?: No Weight loss?: No Fatigue?: No  Skin Skin rash/lesions?: No Itching?: No  Eyes Blurred vision?: No Double vision?: No  Ears/Nose/Throat Sore throat?: No Sinus problems?: No  Hematologic/Lymphatic Swollen glands?: No Easy bruising?: No  Cardiovascular Leg swelling?: No Chest pain?: No  Respiratory Cough?: No Shortness of breath?: No  Endocrine Excessive thirst?: No  Musculoskeletal Back pain?: Yes Joint pain?: No  Neurological Headaches?: No Dizziness?: No  Psychologic Depression?: No Anxiety?: No  Physical Exam: BP (!) 161/79   Pulse 80   Ht 5\' 6"  (1.676 m)   Wt 226 lb 11.2 oz (102.8 kg)   BMI 36.59 kg/m   Constitutional: Well nourished. Alert and oriented, No acute distress. HEENT: [place] AT, moist mucus membranes. Trachea midline, no masses. Cardiovascular: No  clubbing, cyanosis, or edema. Respiratory: Normal respiratory effort, no increased work of breathing. GI: Abdomen is soft, non tender, non distended, no abdominal masses. Liver and spleen not palpable.  No hernias appreciated.  Stool sample for occult testing is not indicated.   GU: Right CVA tenderness.  No bladder fullness or masses.   Skin: No rashes, bruises or suspicious lesions. Lymph: No cervical or inguinal adenopathy. Neurologic: Grossly intact, no focal deficits, moving all 4 extremities. Psychiatric: Normal mood and affect.  Laboratory Data:  Lab Results  Component Value Date   WBC  8.4 10/12/2014   HGB 15.6 10/12/2014   HCT 46.3 10/12/2014   MCV 88.8 10/12/2014   PLT 231 10/12/2014    Lab Results  Component Value Date   CREATININE 1.37 (H) 10/12/2014   PSA History:       1.25 ng/mL on 09/05/2013       3.35 ng/mL on 04/04/2013     2.27 ng/mL on 07/19/2014  2.0 ng/mL on 02/12/2015  2.2 ng/mL on 08/13/2015  Urinalysis 0-2 RBC's.  See EPIC    Assessment & Plan:    1. Right flank pain  - patient with a history of right nephrolithiasis  - obtain a CT Urogram due to [place]  2. Microscopic hematuria  -  I explained to the patient that there are a number of causes that can be associated with blood in the urine, such as stones, BPH, UTI's, damage to the urinary tract and/or cancer.  - At this time, I felt that the patient warranted further urologic evaluation.   The AUA guidelines state that a CT urogram is the preferred imaging study to evaluate hematuria.  - I explained to the patient that a contrast material will be injected into a vein and that in rare instances, an allergic reaction can result and may even life threatening   The patient denies any allergies to contrast, iodine and/or seafood and is not taking metformin.  - Following the imaging study,  I've recommended a cystoscopy. I described how this is performed, typically in an office setting with a flexible  cystoscope. We described the risks, benefits, and possible side effects, the most common of which is a minor amount of blood in the urine and/or burning which usually resolves in 24 to 48 hours.   - The patient had the opportunity to ask questions which were answered. Based upon this discussion, the patient is willing to proceed. Therefore, I've ordered: a CT Urogram and cystoscopy.  - The patient will return following all of the above for discussion of the results.     - UA  - urine culture  - BUN + creatinine  - Advised to contact our office or seek treatment in the ED if becomes febrile or pain/ vomiting are difficult control in order to arrange for emergent/urgent intervention  3. History of nephrolithiasis  - left ureteral stone spontaneously passed last year; patient refused follow up KUB and RUS  - right renal stones seen on CT in 2016    4. Rising PSA   -Not addressed at this visit  - Patient's PSA has risen from 1.25-2.27 and a 10 month time period  - Recent PSA is 2.2  - PSA repeated today  3. BPH with LUTS  - Not addressed at this visit  - IPSS score is 3/2, it is improving  - Continue conservative management, avoiding bladder irritants and timed voiding's  - RTC in 6 months for IPSS, PSA and exam    Return for CT Urogram report and cystoscopy.  Zara Council, [facility] [address], [place], [place] [address] [phone]

## 2016-04-01 LAB — BUN+CREAT
BUN / CREAT RATIO: 11 (ref 9–20)
BUN: 15 mg/dL (ref 6–24)
Creatinine, Ser: 1.35 mg/dL — ABNORMAL HIGH (ref 0.76–1.27)
GFR, EST AFRICAN AMERICAN: 67 mL/min/{1.73_m2} (ref >59)
GFR, EST NON AFRICAN AMERICAN: 58 mL/min/{1.73_m2} — AB (ref >59)

## 2016-04-02 LAB — CULTURE, URINE COMPREHENSIVE

## 2016-04-08 ENCOUNTER — Telehealth: Payer: Self-pay

## 2016-04-08 ENCOUNTER — Ambulatory Visit
Admission: RE | Admit: 2016-04-08 | Discharge: 2016-04-08 | Disposition: A | Discharge status: Home / Self Care | Payer: BLUE CROSS/BLUE SHIELD | Source: Ambulatory Visit | Attending: Urology | Admitting: Urology

## 2016-04-08 DIAGNOSIS — N200 Calculus of kidney: Secondary | ICD-10-CM | POA: Diagnosis not present

## 2016-04-08 DIAGNOSIS — N210 Calculus in bladder: Secondary | ICD-10-CM | POA: Insufficient documentation

## 2016-04-08 DIAGNOSIS — K760 Fatty (change of) liver, not elsewhere classified: Secondary | ICD-10-CM | POA: Diagnosis not present

## 2016-04-08 DIAGNOSIS — R3129 Other microscopic hematuria: Secondary | ICD-10-CM | POA: Diagnosis present

## 2016-04-08 MED ORDER — IOPAMIDOL (ISOVUE-300) INJECTION 61%
150.0000 mL | Freq: Once | INTRAVENOUS | Status: AC | PRN
  Administered 2016-04-08: 125 mL via INTRAVENOUS

## 2016-04-08 NOTE — Telephone Encounter (Signed)
LMOM

## 2016-04-08 NOTE — Telephone Encounter (Signed)
-----   Message from Nori Riis, PA-C sent at 04/08/2016  1:57 PM EST ----- Please notify the patient that he has a bladder stone and needs to have a cystoscopy.

## 2016-04-09 NOTE — Telephone Encounter (Signed)
Spoke with pt in reference to bladder stone and needing a cysto. Pt voiced understanding. Pt made cysto appt with Lattie Haw.

## 2016-04-14 ENCOUNTER — Other Ambulatory Visit: Payer: BLUE CROSS/BLUE SHIELD

## 2016-04-22 ENCOUNTER — Other Ambulatory Visit: Payer: BLUE CROSS/BLUE SHIELD

## 2016-04-24 ENCOUNTER — Telehealth: Payer: Self-pay

## 2016-04-24 NOTE — Telephone Encounter (Signed)
LMOM

## 2016-04-24 NOTE — Telephone Encounter (Signed)
-----   Message from Nori Riis, PA-C sent at 04/23/2016  8:24 PM EST ----- It looks like the patient's cystoscopy has been cancelled twice.  He needs to be rescheduled.

## 2016-04-25 NOTE — Telephone Encounter (Signed)
LMOM

## 2016-04-28 ENCOUNTER — Telehealth: Payer: Self-pay

## 2016-04-28 NOTE — Telephone Encounter (Signed)
LMOM- will send a certified letter.

## 2016-04-28 NOTE — Telephone Encounter (Signed)
Certified letter sent. Tracking #[id number]

## 2016-06-17 IMAGING — CT CT RENAL STONE PROTOCOL
1 of 2 series · 15 of 32 positions shown, 19 images · non-contrast
Comparison: None currently available

ADDENDUM:
05/04/2006 abdominal CT was recovered from the archives and the left
lower lobe pulmonary nodule discussed on impression #3 is stable
from 6223 and benign. No follow-up is required.
CLINICAL DATA: Left flank pain and history of kidney stones.

EXAM:
CT ABDOMEN AND PELVIS WITHOUT CONTRAST
TECHNIQUE: Multidetector CT imaging of the abdomen and pelvis was performed
following the standard protocol without IV contrast.

[Series 2: stone standard full · axial · 0.74mm/px · z∈[-486,-52]mm · 15 of 95 slices shown, 19 images]
[im 4/95  soft-tissue]
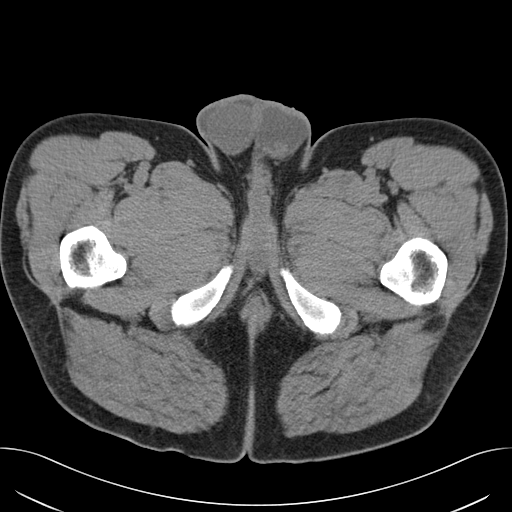
[im 4/95  bone]
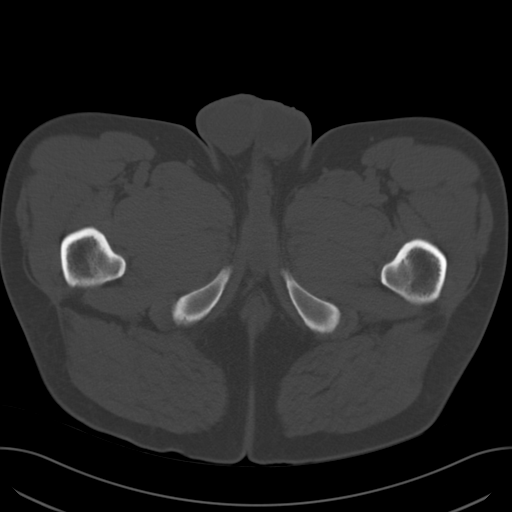
[im 12/95  soft-tissue]
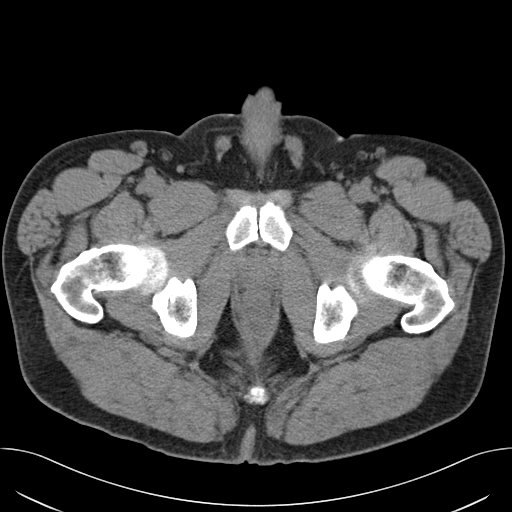
[im 20/95  soft-tissue]
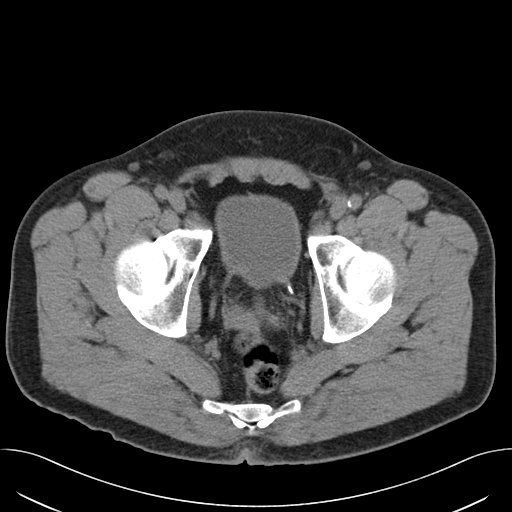
[im 28/95  soft-tissue]
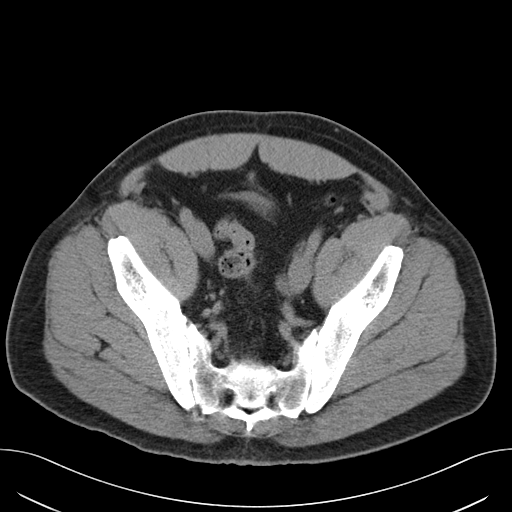
[im 32/95  soft-tissue]
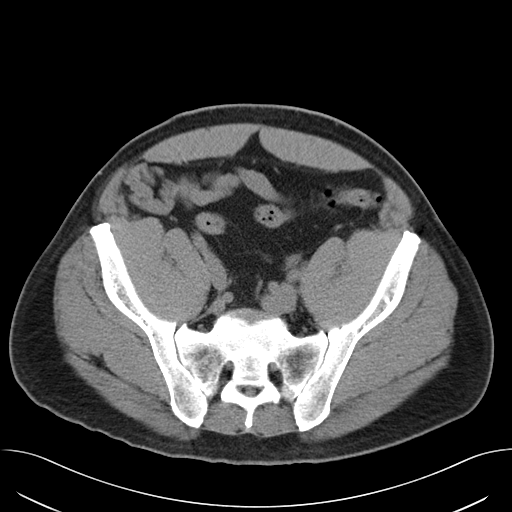
[im 40/95  soft-tissue]
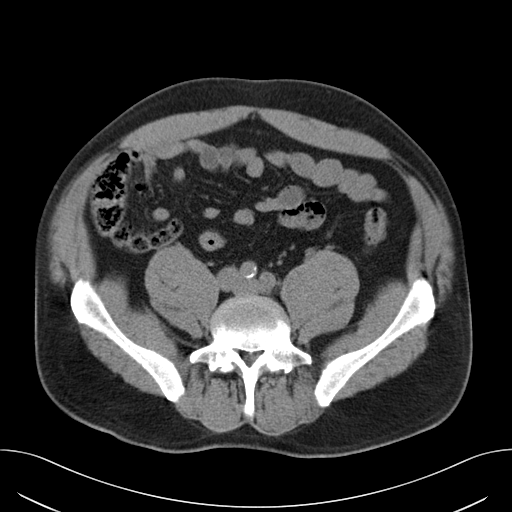
[im 48/95  soft-tissue]
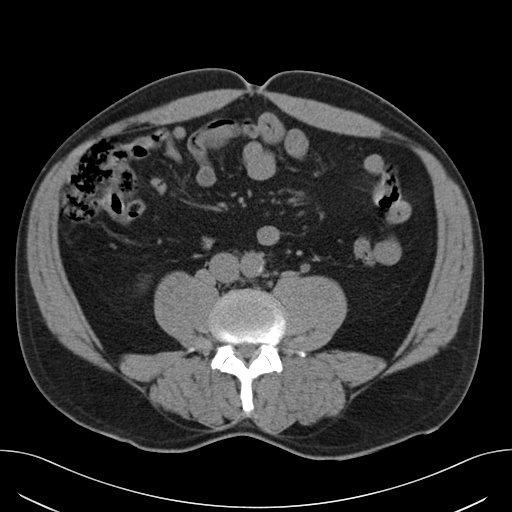
[im 55/95  soft-tissue]
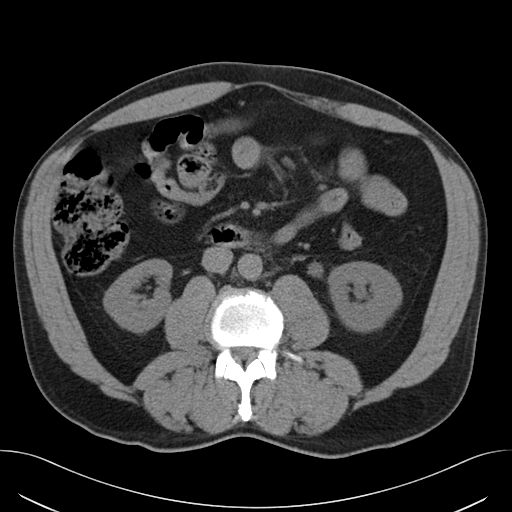
[im 63/95  soft-tissue]
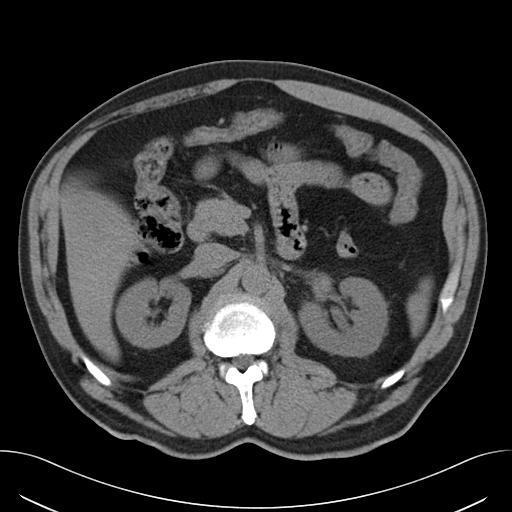
[im 63/95  bone]
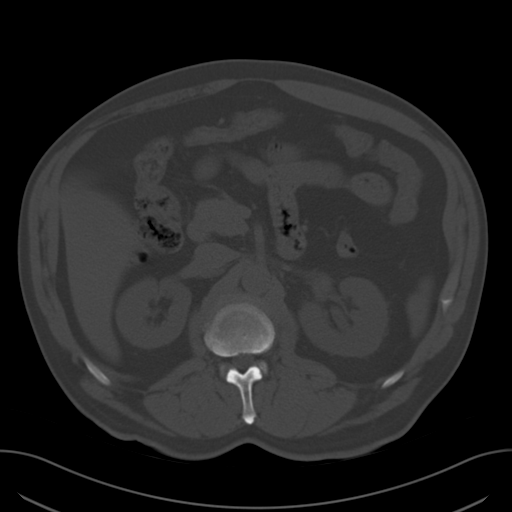
[im 67/95  soft-tissue]
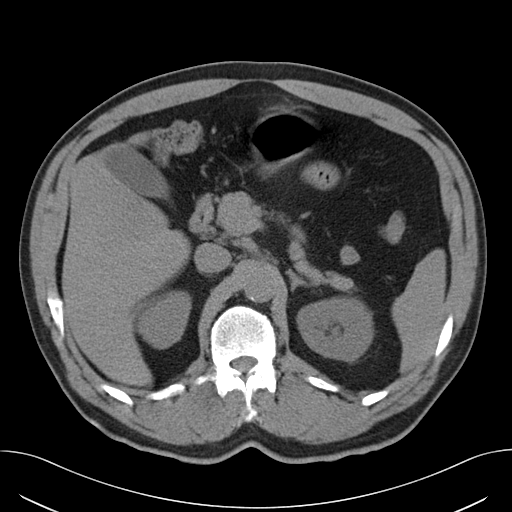
[im 75/95  soft-tissue]
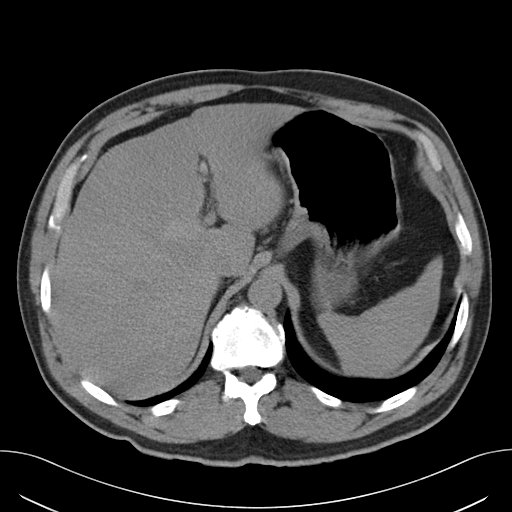
[im 79/95  lung]
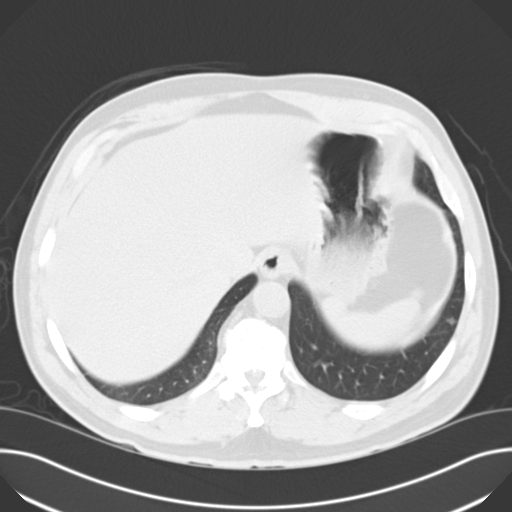
[im 83/95  soft-tissue]
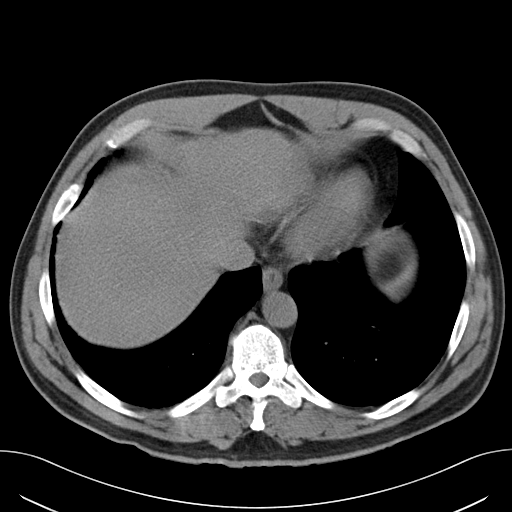
[im 83/95  lung]
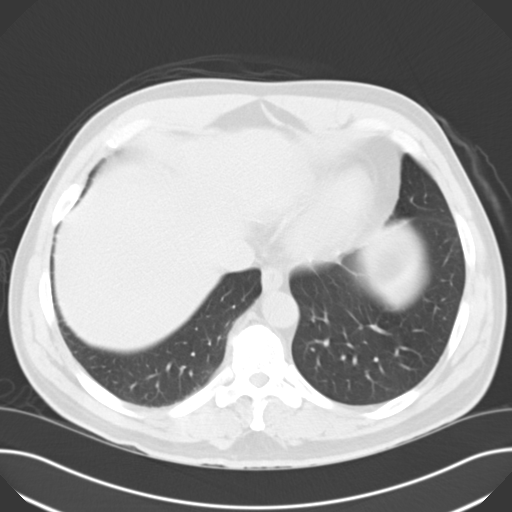
[im 87/95  lung]
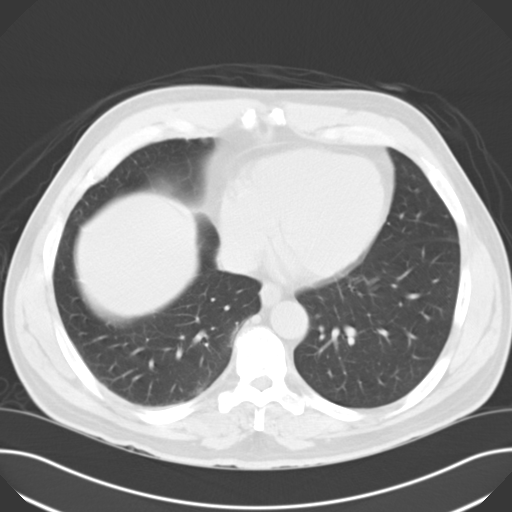
[im 91/95  soft-tissue]
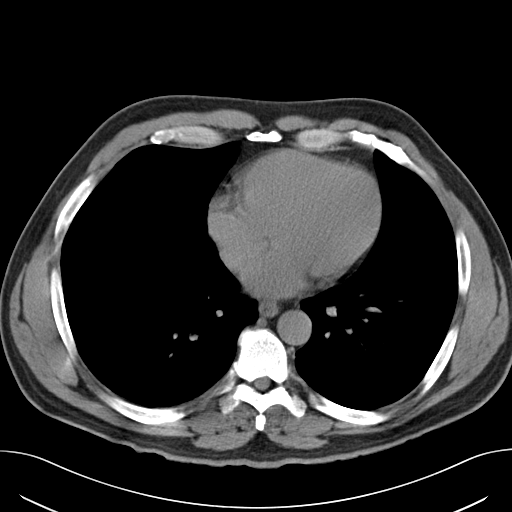
[im 91/95  lung]
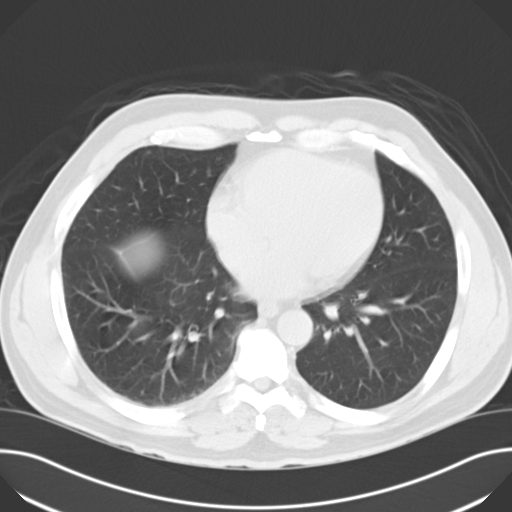

[15 of 32 positions shown; findings below may reference images not displayed]

FINDINGS: BODY WALL: No contributory findings.

LOWER CHEST: 5 mm pulmonary nodule in the left lower lobe on image
18. Unfortunately, 05/04/2006 abdominal CT comparison is not
available.

Coronary atherosclerotic calcification.

ABDOMEN/PELVIS:

Liver: Patchy hepatic steatosis.

Biliary: No evidence of biliary obstruction or stone.

Pancreas: Unremarkable.

Spleen: Unremarkable.

Adrenals: Unremarkable.

Kidneys and ureters: Mild left hydroureteronephrosis secondary to 2
distal left ureteral calculi measuring 4 mm proximally and 6 by 3 mm
distally. Multiple right renal calculi without hydronephrosis or
right ureteral calculus.

Bladder: Unremarkable.

Reproductive: Dystrophic calcifications in the prostate gland.
Asymmetric appearance of the seminal vesicles is best explained by
more vertical orientation of the right vesicle. The seminal vesicles
have overall symmetric volume. Bilateral hydrocele, partly visible.

Bowel: No obstruction. No inflammatory changes.

Retroperitoneum: No mass or adenopathy.

Peritoneum: No ascites or pneumoperitoneum.

Vascular: No acute abnormality.

OSSEOUS: No acute abnormalities.
IMPRESSION: 1. Two stones in the distal left ureter with mild obstruction. The
calculi measure 4 mm and 6 x 3 mm.
2. Right nephrolithiasis.
3. 5 mm left lower lobe pulmonary nodule. Unfortunately, a CT from
05/04/2006 is not available for review and a followup noncontrast
chest CT in 6 to 12 months is recommended in this patient with
smoking history.
4. Hepatic steatosis and coronary atherosclerosis

## 2016-07-24 ENCOUNTER — Other Ambulatory Visit: Payer: Self-pay

## 2016-07-24 DIAGNOSIS — N401 Enlarged prostate with lower urinary tract symptoms: Secondary | ICD-10-CM

## 2016-08-12 ENCOUNTER — Other Ambulatory Visit: Payer: BLUE CROSS/BLUE SHIELD

## 2016-08-12 DIAGNOSIS — N401 Enlarged prostate with lower urinary tract symptoms: Secondary | ICD-10-CM

## 2016-08-13 LAB — PSA: Prostate Specific Ag, Serum: 2.6 ng/mL (ref 0.0–4.0)

## 2016-08-13 NOTE — Progress Notes (Signed)
9:43 AM   Ray Saunders 1960-05-15 [id number]  Referring provider: Sofie Hartigan, MD [place], [place] [address]  Chief Complaint  Patient presents with  . Nephrolithiasis    6 month follow up  . Hematuria    HPI: Patient is a 56 year old Caucasian male with a history of a rising PSA, BPH with LUTS and nephrolithiasis who presents today for a 6 month follow up.    Nephrolithiasis CTU performed in 03/2016 noted bilateral nonobstructing renal calculi, measuring 2-3 mm. No ureteral calculi or hydronephrosis.  4 mm layering bladder calculus.  Mild hepatic steatosis.  Patient was scheduled for cystoscopy, but he did not follow through with the planned procedure.  Patient denies any flank pain and gross hematuria.  He has passed a fragment.  He also denies any fevers, chills, nausea or vomiting.    His UA demonstrates no hematuria.    Rising PSA Patient's PSA has risen from 1.25-2.27 and a 10 month time period.  His most recent PSA was 2.6 ng/mL on 08/12/2016.    BPH WITH LUTS His IPSS score today is 12, which is moderate lower urinary tract symptomatology.  He is pleased with his quality life due to his urinary symptoms.  His previous I PSS score was 3/2.  [name]   His major complaints today are nocturia x 2, weak stream and intermittency.  He has had these symptoms for a few years.  He denies any dysuria, hematuria or suprapubic pain.  He also denies any recent fevers, chills, nausea or vomiting.  Maternal grandfather with prostate cancer.        IPSS    Row Name 08/14/16 0800         International Prostate Symptom Score   How often have you had the sensation of not emptying your bladder? Less than 1 in 5     How often have you had to urinate less than every two hours? Less than half the time     How often have you found you stopped and started again several times when you urinated? Less than half the time     How often have you found it difficult to postpone  urination? Not at All     How often have you had a weak urinary stream? About half the time     How often have you had to strain to start urination? Less than half the time     How many times did you typically get up at night to urinate? 2 Times     Total IPSS Score 12       Quality of Life due to urinary symptoms   If you were to spend the rest of your life with your urinary condition just the way it is now how would you feel about that? Pleased        Score:  1-7 Mild 8-19 Moderate 20-35 Severe    PMH: Past Medical History:  Diagnosis Date  . Alcohol abuse   . Elevated PSA   . Elevated PSA   . GERD (gastroesophageal reflux disease)   . History of kidney stones   . HLD (hyperlipidemia)   . Hypertension     Surgical History: Past Surgical History:  Procedure Laterality Date  . COLONOSCOPY WITH PROPOFOL N/A 10/09/2014   Procedure: COLONOSCOPY WITH PROPOFOL;  Surgeon: Lollie Sails, MD;  [facility] END[facility]COPY;  Service: Endoscopy;  Laterality: N/A;    Home Medications:  Allergies as of 08/14/2016   No Known Allergies     Medication List       Accurate as of 08/14/16  9:43 AM. Always use your most recent med list.          lisinopril 20 MG tablet Commonly known as:  PRINIVIL,ZESTRIL Take 20 mg by mouth daily.   lisinopril-hydrochlorothiazide 20-12.5 MG tablet Commonly known as:  PRINZIDE,ZESTORETIC Take by mouth.       Allergies: No Known Allergies  Family History: Family History  Problem Relation Age of Onset  . Kidney failure Mother        transplant  . Prostate cancer Maternal Grandfather   . Bladder Cancer Neg Hx     Social History:  reports that he quit smoking about 28 years ago. His smoking use included Cigarettes. He has never used smokeless tobacco. He reports that he does not drink alcohol or use drugs.  ROS: UROLOGY Frequent Urination?: No Hard to postpone urination?: No Burning/pain with urination?: No Get up at night to  urinate?: Yes Leakage of urine?: No Urine stream starts and stops?: Yes Trouble starting stream?: No Do you have to strain to urinate?: No Blood in urine?: No Urinary tract infection?: No Sexually transmitted disease?: No Injury to kidneys or bladder?: No Painful intercourse?: No Weak stream?: Yes Erection problems?: Yes Penile pain?: No  Gastrointestinal Nausea?: No Vomiting?: No Indigestion/heartburn?: No Diarrhea?: No Constipation?: No  Constitutional Fever: No Night sweats?: No Weight loss?: No Fatigue?: No  Skin Skin rash/lesions?: No Itching?: No  Eyes Blurred vision?: No Double vision?: No  Ears/Nose/Throat Sore throat?: No Sinus problems?: No  Hematologic/Lymphatic Swollen glands?: No Easy bruising?: No  Cardiovascular Leg swelling?: No Chest pain?: No  Respiratory Cough?: No Shortness of breath?: No  Endocrine Excessive thirst?: No  Musculoskeletal Back pain?: No Joint pain?: No  Neurological Headaches?: No Dizziness?: No  Psychologic Depression?: No Anxiety?: No  Physical Exam: BP (!) 144/85   Pulse 73   Ht 5\' 6"  (1.676 m)   Wt 225 lb (102.1 kg)   BMI 36.32 kg/m   Constitutional: Well nourished. Alert and oriented, No acute distress. HEENT: [place] AT, moist mucus membranes. Trachea midline, no masses. Cardiovascular: No clubbing, cyanosis, or edema. Respiratory: Normal respiratory effort, no increased work of breathing. GI: Abdomen is soft, non tender, non distended, no abdominal masses. Liver and spleen not palpable.  No hernias appreciated.  Stool sample for occult testing is not indicated.   GU: No CVA tenderness.  No bladder fullness or masses.  Patient with circumcise phallus.   Urethral meatus is patent.  No penile discharge. No penile lesions or rashes. Scrotum without lesions, cysts, rashes and/or edema.  Testicles are located scrotally bilaterally. No masses are appreciated in the testicles. Left and right epididymis are  normal. Rectal: Patient with  normal sphincter tone. Anus and perineum without scarring or rashes. No rectal masses are appreciated. Prostate is approximately 55 grams, no nodules are appreciated. Seminal vesicles are normal. Skin: No rashes, bruises or suspicious lesions. Lymph: No cervical or inguinal adenopathy. Neurologic: Grossly intact, no focal deficits, moving all 4 extremities. Psychiatric: Normal mood and affect.  Laboratory Data: Lab Results  Component Value Date   CREATININE 1.35 (H) 03/31/2016   PSA History:       1.25 ng/mL on 09/05/2013       3.35 ng/mL on 04/04/2013     2.27 ng/mL on 07/19/2014  2.0 ng/mL on 02/12/2015  2.2 ng/mL on 08/13/2015  2.6 ng/mL on 08/12/2016  Urinalysis Unremarkable.  See EPIC    Assessment & Plan:    1. Nephrolithiasis  - Bilateral stones seen on CTU in February 2018  - he will bring in the fragment for analysis  - KUB at next visit  - Advised to contact our office or seek treatment in the ED if becomes febrile or pain/ vomiting are difficult control in order to arrange for emergent/urgent intervention  2. Bladder stones  - ? Passed  - KUB at next visit    3. Rising PSA   - Patient's PSA has risen from 1.25-2.27 and a 10 month time period  -  Recent PSA is 2.6  -  Continue close monitor to ensure trend remains stable  -  RTC in 6 months for PSA and exam  4. BPH with LUTS  - IPSS score is 12/1, it is worsening  - Continue conservative management, avoiding bladder irritants and timed voiding's as patient is not interested in starting therapy at this time  - RTC in 6 months for IPSS, PSA and exam    Return in about 6 months (around 02/13/2017) for IPSS, PSA and exam.  Zara Council, [facility]  [place] [address], [place],  [address] [phone]

## 2016-08-14 ENCOUNTER — Ambulatory Visit (INDEPENDENT_AMBULATORY_CARE_PROVIDER_SITE_OTHER): Payer: BLUE CROSS/BLUE SHIELD | Admitting: Urology

## 2016-08-14 ENCOUNTER — Encounter: Payer: Self-pay | Admitting: Urology

## 2016-08-14 VITALS — BP 144/85 | HR 73 | Ht 66.0 in | Wt 225.0 lb

## 2016-08-14 DIAGNOSIS — N200 Calculus of kidney: Secondary | ICD-10-CM | POA: Diagnosis not present

## 2016-08-14 DIAGNOSIS — N210 Calculus in bladder: Secondary | ICD-10-CM

## 2016-08-14 DIAGNOSIS — N401 Enlarged prostate with lower urinary tract symptoms: Secondary | ICD-10-CM | POA: Diagnosis not present

## 2016-08-14 DIAGNOSIS — R972 Elevated prostate specific antigen [PSA]: Secondary | ICD-10-CM

## 2016-08-14 DIAGNOSIS — N138 Other obstructive and reflux uropathy: Secondary | ICD-10-CM

## 2016-08-14 LAB — URINALYSIS, COMPLETE
Bilirubin, UA: NEGATIVE
Glucose, UA: NEGATIVE
KETONES UA: NEGATIVE
Leukocytes, UA: NEGATIVE
NITRITE UA: NEGATIVE
PH UA: 5.5 (ref 5.0–7.5)
Protein, UA: NEGATIVE
RBC, UA: NEGATIVE
Specific Gravity, UA: 1.025 (ref 1.005–1.030)
Urobilinogen, Ur: 0.2 mg/dL (ref 0.2–1.0)

## 2017-01-30 ENCOUNTER — Other Ambulatory Visit: Payer: BLUE CROSS/BLUE SHIELD

## 2017-01-30 DIAGNOSIS — N401 Enlarged prostate with lower urinary tract symptoms: Secondary | ICD-10-CM

## 2017-01-31 LAB — PSA: PROSTATE SPECIFIC AG, SERUM: 2.3 ng/mL (ref 0.0–4.0)

## 2017-02-09 NOTE — Progress Notes (Signed)
8:56 AM   Ray Saunders 02-Aug-1960 [id number]  Referring provider: Sofie Hartigan, MD [place], [place] [address]  No chief complaint on file.   HPI: Patient is a 56 year old Caucasian male with a history of a rising PSA, BPH with LUTS and nephrolithiasis who presents today for a 6 month follow up.    Nephrolithiasis CTU performed in 03/2016 noted bilateral nonobstructing renal calculi, measuring 2-3 mm. No ureteral calculi or hydronephrosis.  4 mm layering bladder calculus.  Mild hepatic steatosis.  Patient was scheduled for cystoscopy, but he did not follow through with the planned procedure.  Patient denies any flank pain and gross hematuria.  He has passed a fragment.  He also denies any fevers, chills, nausea or vomiting.    His UA demonstrates no hematuria.    Rising PSA Patient's PSA has risen from 1.25-2.27 and a 10 month time period.  His most recent PSA was 2.3 ng/mL on 01/30/2017.    BPH WITH LUTS His IPSS score today is 5, which is mild lower urinary tract symptomatology.  He is pleased with his quality life due to his urinary symptoms.  His previous I PSS score was 12/1.  His major complaints today are nocturia x 2, weak stream and intermittency.  He has had these symptoms for a few years.  He denies any dysuria, hematuria or suprapubic pain.  He also denies any recent fevers, chills, nausea or vomiting.  Maternal grandfather with prostate cancer.    IPSS    Row Name 02/11/17 0800         International Prostate Symptom Score   How often have you had the sensation of not emptying your bladder?  Not at All     How often have you had to urinate less than every two hours?  Less than 1 in 5 times     How often have you found you stopped and started again several times when you urinated?  Less than 1 in 5 times     How often have you found it difficult to postpone urination?  Not at All     How often have you had a weak urinary stream?  Less than 1 in 5  times     How often have you had to strain to start urination?  Not at All     How many times did you typically get up at night to urinate?  2 Times     Total IPSS Score  5       Quality of Life due to urinary symptoms   If you were to spend the rest of your life with your urinary condition just the way it is now how would you feel about that?  Pleased        Score:  1-7 Mild 8-19 Moderate 20-35 Severe    PMH: Past Medical History:  Diagnosis Date  . Alcohol abuse   . Elevated PSA   . Elevated PSA   . GERD (gastroesophageal reflux disease)   . History of kidney stones   . HLD (hyperlipidemia)   . Hypertension     Surgical History: Past Surgical History:  Procedure Laterality Date  . COLONOSCOPY WITH PROPOFOL N/A 10/09/2014   Procedure: COLONOSCOPY WITH PROPOFOL;  Surgeon: Lollie Sails, MD;  Location: [facility] ENDOSCOPY;  Service: Endoscopy;  Laterality: N/A;    Home Medications:  Allergies as of 02/11/2017   No Known Allergies  Medication List        Accurate as of 02/11/17  8:56 AM. Always use your most recent med list.          lisinopril-hydrochlorothiazide 20-12.5 MG tablet Commonly known as:  PRINZIDE,ZESTORETIC Take by mouth.       Allergies: No Known Allergies  Family History: Family History  Problem Relation Age of Onset  . Kidney failure Mother        transplant  . Prostate cancer Maternal Grandfather   . Bladder Cancer Neg Hx     Social History:  reports that he quit smoking about 29 years ago. His smoking use included cigarettes. he has never used smokeless tobacco. He reports that he does not drink alcohol or use drugs.  ROS: UROLOGY Frequent Urination?: No Hard to postpone urination?: No Burning/pain with urination?: No Get up at night to urinate?: Yes Leakage of urine?: No Urine stream starts and stops?: No Trouble starting stream?: No Do you have to strain to urinate?: No Blood in urine?: No Urinary tract infection?:  No Sexually transmitted disease?: No Injury to kidneys or bladder?: No Painful intercourse?: No Weak stream?: No Erection problems?: Yes Penile pain?: No  Gastrointestinal Nausea?: No Vomiting?: No Indigestion/heartburn?: No Diarrhea?: No Constipation?: No  Constitutional Fever: No Night sweats?: No Weight loss?: No Fatigue?: No  Skin Skin rash/lesions?: No Itching?: No  Eyes Blurred vision?: No Double vision?: No  Ears/Nose/Throat Sore throat?: No Sinus problems?: No  Hematologic/Lymphatic Swollen glands?: No Easy bruising?: No  Cardiovascular Leg swelling?: No Chest pain?: No  Respiratory Cough?: Yes Shortness of breath?: No  Endocrine Excessive thirst?: No  Musculoskeletal Back pain?: Yes Joint pain?: No  Neurological Headaches?: No Dizziness?: No  Psychologic Depression?: No Anxiety?: No  Physical Exam: BP 137/81 (BP Location: Right Arm, Patient Position: Sitting, Cuff Size: Large)   Pulse 68   Ht 5\' 6"  (1.676 m)   Wt 222 lb 4.8 oz (100.8 kg)   BMI 35.88 kg/m   Constitutional: Well nourished. Alert and oriented, No acute distress. HEENT: [place] AT, moist mucus membranes. Trachea midline, no masses. Cardiovascular: No clubbing, cyanosis, or edema. Respiratory: Normal respiratory effort, no increased work of breathing. GI: Abdomen is soft, non tender, non distended, no abdominal masses. Liver and spleen not palpable.  No hernias appreciated.  Stool sample for occult testing is not indicated.   GU: No CVA tenderness.  No bladder fullness or masses.  Patient with circumcise phallus.   Urethral meatus is patent.  No penile discharge. No penile lesions or rashes. Scrotum without lesions, cysts, rashes and/or edema.  Testicles are located scrotally bilaterally. No masses are appreciated in the testicles. Left and right epididymis are normal. Rectal: Patient with  normal sphincter tone. Anus and perineum without scarring or rashes. No rectal masses  are appreciated. Prostate is approximately 55 grams, no nodules are appreciated. Seminal vesicles are normal. Skin: No rashes, bruises or suspicious lesions. Lymph: No cervical or inguinal adenopathy. Neurologic: Grossly intact, no focal deficits, moving all 4 extremities. Psychiatric: Normal mood and affect.  Laboratory Data: Lab Results  Component Value Date   CREATININE 1.35 (H) 03/31/2016   PSA History:       1.25 ng/mL on 09/05/2013       3.35 ng/mL on 04/04/2013     2.27 ng/mL on 07/19/2014  2.0 ng/mL on 02/12/2015  2.2 ng/mL on 08/13/2015  2.6 ng/mL on 08/12/2016  2.3 ng/mL on 01/30/2017  Urinalysis Unremarkable.  See EPIC   I  have reviewed the labs.  Assessment & Plan:    1. Nephrolithiasis  - Bilateral stones seen on CTU in February 2018  - he will bring in the fragment for analysis - patient did not bring in the fragment  - KUB at next visit - patient did not have time to get an xray today  - Advised to contact our office or seek treatment in the ED if becomes febrile or pain/ vomiting are difficult control in order to arrange for emergent/urgent intervention  2. Bladder stones  - ? Passed  - KUB at next visit - see above      3. Rising PSA   - Patient's PSA has risen from 1.25-2.27 and a 10 month time period  -  Recent PSA is 2.3, stable  -  Continue close monitor to ensure trend remains stable  -  RTC in 6 months for PSA and exam  4. BPH with LUTS  - IPSS score is 5/1, it is improving   - Continue conservative management, avoiding bladder irritants and timed voiding's as patient is not interested in starting therapy at this time  - RTC in 6 months for IPSS, PSA and exam    Return in about 6 months (around 08/12/2017) for IPSS, PSA and exam.  Zara Council, [facility]  [place] [address], [place], [place] [address] [phone]

## 2017-02-11 ENCOUNTER — Encounter: Payer: Self-pay | Admitting: Urology

## 2017-02-11 ENCOUNTER — Ambulatory Visit: Payer: BLUE CROSS/BLUE SHIELD | Admitting: Urology

## 2017-02-11 VITALS — BP 137/81 | HR 68 | Ht 66.0 in | Wt 222.3 lb

## 2017-02-11 DIAGNOSIS — N200 Calculus of kidney: Secondary | ICD-10-CM

## 2017-02-11 DIAGNOSIS — N401 Enlarged prostate with lower urinary tract symptoms: Secondary | ICD-10-CM | POA: Diagnosis not present

## 2017-02-11 DIAGNOSIS — N138 Other obstructive and reflux uropathy: Secondary | ICD-10-CM | POA: Diagnosis not present

## 2017-02-11 DIAGNOSIS — N210 Calculus in bladder: Secondary | ICD-10-CM | POA: Diagnosis not present

## 2017-02-11 DIAGNOSIS — R972 Elevated prostate specific antigen [PSA]: Secondary | ICD-10-CM

## 2017-02-11 LAB — URINALYSIS, COMPLETE
Bilirubin, UA: NEGATIVE
Glucose, UA: NEGATIVE
Ketones, UA: NEGATIVE
Leukocytes, UA: NEGATIVE
Nitrite, UA: NEGATIVE
Protein, UA: NEGATIVE
RBC, UA: NEGATIVE
Specific Gravity, UA: 1.02 (ref 1.005–1.030)
Urobilinogen, Ur: 0.2 mg/dL (ref 0.2–1.0)
pH, UA: 5.5 (ref 5.0–7.5)

## 2017-07-07 DIAGNOSIS — R079 Chest pain, unspecified: Secondary | ICD-10-CM | POA: Insufficient documentation

## 2017-07-07 DIAGNOSIS — R0609 Other forms of dyspnea: Secondary | ICD-10-CM

## 2017-08-04 ENCOUNTER — Other Ambulatory Visit: Payer: BLUE CROSS/BLUE SHIELD

## 2017-08-06 ENCOUNTER — Other Ambulatory Visit: Payer: Self-pay | Admitting: Family Medicine

## 2017-08-06 DIAGNOSIS — R972 Elevated prostate specific antigen [PSA]: Secondary | ICD-10-CM

## 2017-08-07 ENCOUNTER — Other Ambulatory Visit: Payer: BLUE CROSS/BLUE SHIELD

## 2017-08-07 DIAGNOSIS — R972 Elevated prostate specific antigen [PSA]: Secondary | ICD-10-CM

## 2017-08-08 LAB — PSA: PROSTATE SPECIFIC AG, SERUM: 2.5 ng/mL (ref 0.0–4.0)

## 2017-08-08 NOTE — Progress Notes (Signed)
9:12 AM   Ray Saunders [birth year] [id number]  Referring provider: Sofie Hartigan, MD [place], [place] [address]  Chief Complaint  Patient presents with  . Nephrolithiasis    HPI: Patient is a 57 year old Caucasian male with a history of a rising PSA, BPH with LUTS and nephrolithiasis who presents today for a 6 month follow up.    Nephrolithiasis CTU performed in 03/2016 noted bilateral nonobstructing renal calculi, measuring 2-3 mm. No ureteral calculi or hydronephrosis.  4 mm layering bladder calculus.  Mild hepatic steatosis.  Patient was scheduled for cystoscopy, but he did not follow through with the planned procedure.  Patient denies any flank pain and gross hematuria.  He has passed a fragment.  He also denies any fevers, chills, nausea or vomiting.    His UA demonstrates no hematuria.    Rising PSA Patient's PSA has risen from 1.25-2.27 and a 10 month time period.  His most recent PSA was 2.5 ng/mL on 08/07/2017.    BPH WITH LUTS His IPSS score today is 7,  which is mild lower urinary tract symptomatology.  He is mostly satisfied with his quality life due to his urinary symptoms.  His previous I PSS score was 5/1.  His major complaints today are nocturia x 2, weak stream and intermittency these have unchanged over the last few years.  He denies any dysuria, hematuria or suprapubic pain.  He also denies any recent fevers, chills, nausea or vomiting.  Maternal grandfather with prostate cancer.   IPSS    Row Name 08/10/17 0800         International Prostate Symptom Score   How often have you had the sensation of not emptying your bladder?  Less than 1 in 5     How often have you had to urinate less than every two hours?  Less than half the time     How often have you found you stopped and started again several times when you urinated?  Less than 1 in 5 times     How often have you found it difficult to postpone urination?  Not at All     How often have  you had a weak urinary stream?  Less than half the time     How often have you had to strain to start urination?  Not at All     How many times did you typically get up at night to urinate?  1 Time     Total IPSS Score  7       Quality of Life due to urinary symptoms   If you were to spend the rest of your life with your urinary condition just the way it is now how would you feel about that?  Mostly Satisfied        Score:  1-7 Mild 8-19 Moderate 20-35 Severe    PMH: Past Medical History:  Diagnosis Date  . Alcohol abuse   . Elevated PSA   . Elevated PSA   . GERD (gastroesophageal reflux disease)   . History of kidney stones   . HLD (hyperlipidemia)   . Hypertension     Surgical History: Past Surgical History:  Procedure Laterality Date  . COLONOSCOPY WITH PROPOFOL N/A 10/09/2014   Procedure: COLONOSCOPY WITH PROPOFOL;  Surgeon: Lollie Sails, MD;  Location: [facility] ENDOSCOPY;  Service: Endoscopy;  Laterality: N/A;    Home Medications:  Allergies as of 08/10/2017   No Known  Allergies     Medication List        Accurate as of 08/10/17  9:12 AM. Always use your most recent med list.          lisinopril-hydrochlorothiazide 20-12.5 MG tablet Commonly known as:  PRINZIDE,ZESTORETIC Take by mouth.       Allergies: No Known Allergies  Family History: Family History  Problem Relation Age of Onset  . Kidney failure Mother        transplant  . Prostate cancer Maternal Grandfather   . Bladder Cancer Neg Hx     Social History:  reports that he quit smoking about 29 years ago. His smoking use included cigarettes. He has never used smokeless tobacco. He reports that he does not drink alcohol or use drugs.  ROS: UROLOGY Frequent Urination?: No Hard to postpone urination?: No Burning/pain with urination?: No Get up at night to urinate?: Yes Leakage of urine?: No Urine stream starts and stops?: Yes Trouble starting stream?: No Do you have to strain to  urinate?: No Blood in urine?: No Urinary tract infection?: No Sexually transmitted disease?: No Injury to kidneys or bladder?: No Painful intercourse?: No Weak stream?: Yes Erection problems?: No Penile pain?: No  Gastrointestinal Nausea?: No Vomiting?: No Indigestion/heartburn?: No Diarrhea?: No Constipation?: No  Constitutional Fever: No Night sweats?: No Weight loss?: No Fatigue?: No  Skin Skin rash/lesions?: No Itching?: No  Eyes Blurred vision?: No Double vision?: No  Ears/Nose/Throat Sore throat?: No Sinus problems?: No  Hematologic/Lymphatic Swollen glands?: No Easy bruising?: No  Cardiovascular Leg swelling?: No Chest pain?: No  Respiratory Cough?: Yes Shortness of breath?: No  Endocrine Excessive thirst?: No  Musculoskeletal Back pain?: No Joint pain?: Yes  Neurological Headaches?: No Dizziness?: No  Psychologic Depression?: No Anxiety?: No  Physical Exam: BP 127/82 (BP Location: Right Arm, Patient Position: Sitting)   Pulse 69   Ht 5\' 6"  (1.676 m)   Wt 221 lb 8 oz (100.5 kg)   BMI 35.75 kg/m   Constitutional: Well nourished. Alert and oriented, No acute distress. HEENT: [place] AT, moist mucus membranes. Trachea midline, no masses. Cardiovascular: No clubbing, cyanosis, or edema. Respiratory: Normal respiratory effort, no increased work of breathing. GI: Abdomen is soft, non tender, non distended, no abdominal masses. Liver and spleen not palpable.  No hernias appreciated.  Stool sample for occult testing is not indicated.   GU: No CVA tenderness.  No bladder fullness or masses.  Patient with circumcised phallus.  Urethral meatus is patent.  No penile discharge. No penile lesions or rashes. Scrotum without lesions, cysts, rashes and/or edema.  Testicles are located scrotally bilaterally. No masses are appreciated in the testicles. Left and right epididymis are normal. Rectal: Patient with  normal sphincter tone. Anus and perineum  without scarring or rashes. No rectal masses are appreciated. Prostate is approximately 50 grams, 5 mm x 5 mm nodule in the right apex.  Seminal vesicles are normal. Skin: No rashes, bruises or suspicious lesions. Lymph: No cervical or inguinal adenopathy. Neurologic: Grossly intact, no focal deficits, moving all 4 extremities. Psychiatric: Normal mood and affect.   Laboratory Data: Lab Results  Component Value Date   CREATININE 1.35 (H) 03/31/2016   PSA History:       1.25 ng/mL on 09/05/2013       3.35 ng/mL on 04/04/2013     2.27 ng/mL on 07/19/2014  2.0 ng/mL on 02/12/2015  2.2 ng/mL on 08/13/2015  2.6 ng/mL on 08/12/2016  2.3 ng/mL on 01/30/2017  2.5 ng/mL on 08/07/2017  I have reviewed the labs.  Urinalysis Unremarkable.  See EPIC   I have reviewed the labs.  Assessment & Plan:    1. History of hematuria Completed CTU in 03/2016 Refused cystoscopy; patient is not having any gross hematuria and his UA is negative - he states he is feeling fine and does not want to occur additional expenses  2. Nephrolithiasis Bilateral stones seen on CTU in February 2018 KUB not performed as patient feels fine and does not want to occur expense  Advised to contact our office or seek treatment in the ED if becomes febrile or pain/ vomiting are difficult control in order to arrange for emergent/urgent intervention  3. Bladder stones KUB not performed - see above    4. Rising PSA PSA rose from 1.25-2.27 and a 10 month time period Recent PSA is 2.5 - with abnormal DRE Patient will be schedule for a TRUSPBx of prostate.  The procedure is explained and the risks involved, such as blood in urine, blood in stool, blood in semen, infection, urinary retention, and on rare occasions sepsis and death.  Patient understands the risks as explained to him and he wishes to proceed.    5. Prostate nodule See above and exam  6. BPH with LUTS  - IPSS score is 7/2, it is worsening   - Continue  conservative management, avoiding bladder irritants and timed voiding's as patient is not interested in starting therapy at this time  - RTC based on prostate biopsy results    Return for TRUSPBx of prostate .  Zara Council, PA-C  [facility] [address] [place],  [address] [phone]

## 2017-08-10 ENCOUNTER — Ambulatory Visit (INDEPENDENT_AMBULATORY_CARE_PROVIDER_SITE_OTHER): Payer: BLUE CROSS/BLUE SHIELD | Admitting: Urology

## 2017-08-10 ENCOUNTER — Encounter: Payer: Self-pay | Admitting: Urology

## 2017-08-10 ENCOUNTER — Other Ambulatory Visit: Payer: Self-pay

## 2017-08-10 VITALS — BP 127/82 | HR 69 | Ht 66.0 in | Wt 221.5 lb

## 2017-08-10 DIAGNOSIS — Z87448 Personal history of other diseases of urinary system: Secondary | ICD-10-CM | POA: Diagnosis not present

## 2017-08-10 DIAGNOSIS — N401 Enlarged prostate with lower urinary tract symptoms: Secondary | ICD-10-CM

## 2017-08-10 DIAGNOSIS — N402 Nodular prostate without lower urinary tract symptoms: Secondary | ICD-10-CM | POA: Diagnosis not present

## 2017-08-10 DIAGNOSIS — N210 Calculus in bladder: Secondary | ICD-10-CM

## 2017-08-10 DIAGNOSIS — R972 Elevated prostate specific antigen [PSA]: Secondary | ICD-10-CM | POA: Diagnosis not present

## 2017-08-10 DIAGNOSIS — N200 Calculus of kidney: Secondary | ICD-10-CM | POA: Diagnosis not present

## 2017-08-10 DIAGNOSIS — N138 Other obstructive and reflux uropathy: Secondary | ICD-10-CM

## 2017-08-10 LAB — MICROSCOPIC EXAMINATION
BACTERIA UA: NONE SEEN
WBC UA: NONE SEEN /HPF (ref 0–5)

## 2017-08-10 LAB — URINALYSIS, COMPLETE
Bilirubin, UA: NEGATIVE
GLUCOSE, UA: NEGATIVE
Ketones, UA: NEGATIVE
Leukocytes, UA: NEGATIVE
NITRITE UA: NEGATIVE
PROTEIN UA: NEGATIVE
RBC, UA: NEGATIVE
Specific Gravity, UA: 1.025 (ref 1.005–1.030)
Urobilinogen, Ur: 0.2 mg/dL (ref 0.2–1.0)
pH, UA: 5.5 (ref 5.0–7.5)

## 2017-08-11 ENCOUNTER — Ambulatory Visit: Payer: BLUE CROSS/BLUE SHIELD | Admitting: Urology

## 2017-09-21 ENCOUNTER — Ambulatory Visit: Payer: BLUE CROSS/BLUE SHIELD | Admitting: Urology

## 2017-09-21 ENCOUNTER — Other Ambulatory Visit: Payer: Self-pay | Admitting: Urology

## 2017-09-21 VITALS — BP 137/81 | HR 76 | Ht 66.0 in | Wt 223.6 lb

## 2017-09-21 DIAGNOSIS — R972 Elevated prostate specific antigen [PSA]: Secondary | ICD-10-CM

## 2017-09-21 NOTE — Progress Notes (Signed)
Prostate Biopsy Procedure   57 year old male with a slowly rising PSA over the last 2 years. Informed consent was obtained after discussing risks/benefits of the procedure.  A time out was performed to ensure correct patient identity.  Pre-Procedure: - Last PSA Level: 2.5 (June 2019) - Gentamicin given prophylactically - Levaquin 500 mg administered PO -Transrectal Ultrasound performed revealing a 35 gm prostate -No significant hypoechoic or median lobe noted  Procedure: - Prostate block performed using 10 cc 1% lidocaine and biopsies taken from sextant areas, a total of 12 under ultrasound guidance.  Post-Procedure: - Patient tolerated the procedure well - He was counseled to seek immediate medical attention if experiences any severe pain, significant bleeding, or fevers - Return in one week to discuss biopsy results  John Giovanni, MD

## 2017-09-24 ENCOUNTER — Other Ambulatory Visit: Payer: Self-pay | Admitting: Urology

## 2017-09-24 LAB — PATHOLOGY REPORT

## 2017-10-06 ENCOUNTER — Other Ambulatory Visit: Payer: Self-pay

## 2017-10-06 ENCOUNTER — Ambulatory Visit (INDEPENDENT_AMBULATORY_CARE_PROVIDER_SITE_OTHER): Payer: BLUE CROSS/BLUE SHIELD | Admitting: Urology

## 2017-10-06 ENCOUNTER — Encounter: Payer: Self-pay | Admitting: Urology

## 2017-10-06 VITALS — BP 140/70 | Ht 66.0 in | Wt 219.5 lb

## 2017-10-06 DIAGNOSIS — C61 Malignant neoplasm of prostate: Secondary | ICD-10-CM | POA: Diagnosis not present

## 2017-10-07 ENCOUNTER — Encounter: Payer: Self-pay | Admitting: Urology

## 2017-10-07 NOTE — Progress Notes (Signed)
10/06/2017 7:11 AM   Ray Saunders Mar 12, 1960 [id number]  Referring provider: Sofie Hartigan, MD [place], [place] [address]  Chief Complaint  Patient presents with  . Biopsy Results    HPI: 57 year old male presents for prostate biopsy follow-up.  He underwent biopsy on 09/21/2017 for a slowly rising PSA level.  Our PA had appreciated a right prostate nodule however DRE was benign at the time of his biopsy.  Prostate volume is 35 g.  Standard 12 core biopsies were performed.  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  The remaining biopsies were all benign.  PMH: Past Medical History:  Diagnosis Date  . Alcohol abuse   . Elevated PSA   . Elevated PSA   . GERD (gastroesophageal reflux disease)   . History of kidney stones   . HLD (hyperlipidemia)   . Hypertension     Surgical History: Past Surgical History:  Procedure Laterality Date  . COLONOSCOPY WITH PROPOFOL N/A 10/09/2014   Procedure: COLONOSCOPY WITH PROPOFOL;  Surgeon: Lollie Sails, MD;  Location: [facility] ENDOSCOPY;  Service: Endoscopy;  Laterality: N/A;    Home Medications:  Allergies as of 10/06/2017   No Known Allergies     Medication List        Accurate as of 10/06/17 11:59 PM. Always use your most recent med list.          lisinopril-hydrochlorothiazide 20-12.5 MG tablet Commonly known as:  PRINZIDE,ZESTORETIC Take by mouth.       Allergies: No Known Allergies  Family History: Family History  Problem Relation Age of Onset  . Kidney failure Mother        transplant  . Prostate cancer Maternal Grandfather   . Bladder Cancer Neg Hx     Social History:  reports that he quit smoking about 30 years ago. His smoking use included cigarettes. He has never used smokeless tobacco. He reports that he does not drink alcohol or use drugs.  ROS: UROLOGY Frequent Urination?: No Hard to postpone  urination?: No Burning/pain with urination?: No Get up at night to urinate?: Yes Leakage of urine?: No Urine stream starts and stops?: Yes Trouble starting stream?: No Do you have to strain to urinate?: No Blood in urine?: No Urinary tract infection?: No Sexually transmitted disease?: No Injury to kidneys or bladder?: No Painful intercourse?: No Weak stream?: No Erection problems?: Yes Penile pain?: No  Gastrointestinal Nausea?: No Vomiting?: No Indigestion/heartburn?: No Diarrhea?: No Constipation?: No  Constitutional Fever: No Night sweats?: No Weight loss?: No Fatigue?: No  Skin Skin rash/lesions?: No Itching?: No  Eyes Blurred vision?: No Double vision?: No  Ears/Nose/Throat Sore throat?: No Sinus problems?: No  Hematologic/Lymphatic Swollen glands?: No Easy bruising?: No  Cardiovascular Leg swelling?: No Chest pain?: No  Respiratory Cough?: No Shortness of breath?: No  Endocrine Excessive thirst?: No  Musculoskeletal Back pain?: No Joint pain?: No  Neurological Headaches?: No Dizziness?: No  Psychologic Depression?: No Anxiety?: No  Physical Exam: BP 140/70 (BP Location: Left Arm, Patient Position: Sitting, Cuff Size: Large)   Ht 5\' 6"  (1.676 m)   Wt 219 lb 8 oz (99.6 kg)   BMI 35.43 kg/m   Constitutional:  Alert and oriented, No acute distress.   Assessment & Plan:   57 year old male with a clinical T1c very low risk prostate cancer.  The pathology report was discussed in detail.  He is an excellent  candidate for active surveillance which was discussed in detail.  Curative treatment options were also reviewed including RALP, EBRT, brachytherapy and HIFU.  The pros and cons of each option were reviewed in detail.  He was provided literature on diagnosis and treatment of prostate cancer.  At this point he has elected active surveillance but will consider the other options.  Will schedule a tentative follow-up for a PSA/DRE in 3  months.  Would recommend a prostate MRI in approximately 6 months.  He was also offered a referral to radiation oncology and to a tertiary center multidisciplinary clinic for a second opinion.   Return in about 3 months (around 01/06/2018) for Recheck, PSA.  Abbie Sons, [place] [address], [place], [place] [address] [phone]

## 2018-01-12 ENCOUNTER — Ambulatory Visit (INDEPENDENT_AMBULATORY_CARE_PROVIDER_SITE_OTHER): Payer: BLUE CROSS/BLUE SHIELD | Admitting: Urology

## 2018-01-12 ENCOUNTER — Encounter: Payer: Self-pay | Admitting: Urology

## 2018-01-12 DIAGNOSIS — C61 Malignant neoplasm of prostate: Secondary | ICD-10-CM | POA: Diagnosis not present

## 2018-01-12 NOTE — Progress Notes (Signed)
01/12/2018 7:02 PM   Ray Saunders Jun 13, 1960 [id number]  Referring provider: Sofie Hartigan, MD [address] [place], [place] [address]  Chief Complaint  Patient presents with  . Prostate Cancer   Urologic history: 1.  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  He has no complaints today.  No bothersome lower urinary tract symptoms.  Denies dysuria or gross hematuria.  Denies flank/abdominal/pelvic or scrotal pain.   PMH: Past Medical History:  Diagnosis Date  . Alcohol abuse   . Elevated PSA   . Elevated PSA   . GERD (gastroesophageal reflux disease)   . History of kidney stones   . HLD (hyperlipidemia)   . Hypertension     Surgical History: Past Surgical History:  Procedure Laterality Date  . COLONOSCOPY WITH PROPOFOL N/A 10/09/2014   Procedure: COLONOSCOPY WITH PROPOFOL;  Surgeon: Lollie Sails, MD;  Location[facility] ENDOSCOPY;  Service: Endoscopy;  Laterality: N/A;    Home Medications:  Allergies as of 01/12/2018   No Known Allergies     Medication List        Accurate as of 01/12/18  7:02 PM. Always use your most recent med list.          lisinopril-hydrochlorothiazide 20-12.5 MG tablet Commonly known as:  PRINZIDE,ZESTORETIC TAKE ONE (1) TABLET EACH DAY       Allergies: No Known Allergies  Family History: Family History  Problem Relation Age of Onset  . Kidney failure Mother        transplant  . Prostate cancer Maternal Grandfather   . Bladder Cancer Neg Hx     Social History:  reports that he quit smoking about 30 years ago. His smoking use included cigarettes. He has never used smokeless tobacco. He reports that he does not drink alcohol or use drugs.  ROS: UROLOGY Frequent  Urination?: No Hard to postpone urination?: No Burning/pain with urination?: No Get up at night to urinate?: Yes Leakage of urine?: No Urine stream starts and stops?: Yes Trouble starting stream?: No Do you have to strain to urinate?: No Blood in urine?: No Urinary tract infection?: No Sexually transmitted disease?: No Injury to kidneys or bladder?: No Painful intercourse?: No Weak stream?: No Erection problems?: Yes Penile pain?: No  Gastrointestinal Nausea?: No Vomiting?: No Indigestion/heartburn?: No Diarrhea?: No Constipation?: No  Constitutional Fever: No Night sweats?: No Weight loss?: No Fatigue?: No  Skin Skin rash/lesions?: No Itching?: No  Eyes Blurred vision?: No Double vision?: No  Ears/Nose/Throat Sore throat?: No Sinus problems?: No  Hematologic/Lymphatic Swollen glands?: No Easy bruising?: No  Cardiovascular Leg swelling?: No Chest pain?: No  Respiratory Cough?: No Shortness of breath?: No  Endocrine Excessive thirst?: No  Musculoskeletal Back pain?: No Joint pain?: No  Neurological Headaches?: No Dizziness?: No  Psychologic Depression?: No Anxiety?: No  Physical Exam: BP 125/81 (BP Location: Left Arm, Patient Position: Sitting, Cuff Size: Large)   Pulse 78   Ht 5\' 6"  (1.676 m)   Wt 213 lb 9.6 oz (96.9 kg)   BMI 34.48 kg/m   Constitutional:  Alert and oriented, No acute distress. HEENT: [place] AT, moist mucus membranes.  Trachea midline, no masses. Cardiovascular: No clubbing, cyanosis, or edema. Respiratory: Normal respiratory effort, no increased work of breathing. GI: Abdomen  is soft, nontender, nondistended, no abdominal masses GU: No CVA tenderness.  Prostate 30 g, smooth without nodules   Assessment & Plan:   57 year old male with very low risk prostate cancer who has elected active surveillance.  A PSA was drawn today.  If stable he will follow-up in 4 months.  Will obtain a prostate MRI 10-12 months post biopsy if  PSA remains stable.  We also discussed one-year confirmatory biopsy.  Return in about 4 months (around 05/13/2018) for Recheck, PSA.   Abbie Sons, [place] [address], [place], [place] [address] [phone]

## 2018-01-13 LAB — PSA: PROSTATE SPECIFIC AG, SERUM: 2.7 ng/mL (ref 0.0–4.0)

## 2018-01-18 ENCOUNTER — Telehealth: Payer: Self-pay

## 2018-01-18 NOTE — Telephone Encounter (Signed)
Left message for patient to call the office to discuss PSA results and recommendations.

## 2018-01-18 NOTE — Telephone Encounter (Signed)
-----   Message from Abbie Sons, MD sent at 01/17/2018 12:32 PM EST ----- PSA stable at 2.7.  Follow-up as scheduled.

## 2018-01-18 NOTE — Telephone Encounter (Signed)
Patient called back and results were given   Ray Saunders

## 2018-05-07 ENCOUNTER — Other Ambulatory Visit: Payer: BLUE CROSS/BLUE SHIELD

## 2018-05-11 ENCOUNTER — Ambulatory Visit: Payer: BLUE CROSS/BLUE SHIELD | Admitting: Urology

## 2018-05-12 ENCOUNTER — Other Ambulatory Visit: Payer: Self-pay

## 2018-05-12 DIAGNOSIS — C61 Malignant neoplasm of prostate: Secondary | ICD-10-CM

## 2018-05-14 ENCOUNTER — Other Ambulatory Visit: Payer: Self-pay

## 2018-05-14 ENCOUNTER — Other Ambulatory Visit: Payer: BLUE CROSS/BLUE SHIELD

## 2018-05-14 DIAGNOSIS — C61 Malignant neoplasm of prostate: Secondary | ICD-10-CM

## 2018-05-15 LAB — PSA: Prostate Specific Ag, Serum: 3.4 ng/mL (ref 0.0–4.0)

## 2018-05-16 ENCOUNTER — Other Ambulatory Visit: Payer: Self-pay | Admitting: Urology

## 2018-05-16 DIAGNOSIS — C61 Malignant neoplasm of prostate: Secondary | ICD-10-CM

## 2018-05-18 ENCOUNTER — Telehealth: Payer: Self-pay | Admitting: Family Medicine

## 2018-05-18 NOTE — Telephone Encounter (Signed)
Patient notified. Sharyn Lull please get this approved. Thank you

## 2018-05-18 NOTE — Telephone Encounter (Signed)
-----   Message from Abbie Sons, MD sent at 05/16/2018 12:12 PM EDT ----- PSA has increased slightly to 3.4.  Recommend scheduling a prostate MRI.  He has a follow-up rescheduled in May.  MRI order was entered.  Sharyn Lull, the MRI can be delayed a month)

## 2018-05-18 NOTE — Telephone Encounter (Signed)
LMOM for patient to return call.

## 2018-05-18 NOTE — Telephone Encounter (Signed)
Pt called you back and states that he can't answer his phone at work, He asked if you could leave a message on his phone?

## 2018-05-19 ENCOUNTER — Ambulatory Visit: Payer: BLUE CROSS/BLUE SHIELD | Admitting: Urology

## 2018-06-29 ENCOUNTER — Ambulatory Visit: Payer: BLUE CROSS/BLUE SHIELD | Admitting: Urology

## 2018-07-07 ENCOUNTER — Other Ambulatory Visit: Payer: Self-pay

## 2018-07-07 ENCOUNTER — Telehealth (INDEPENDENT_AMBULATORY_CARE_PROVIDER_SITE_OTHER): Payer: BLUE CROSS/BLUE SHIELD | Admitting: Urology

## 2018-07-07 DIAGNOSIS — C61 Malignant neoplasm of prostate: Secondary | ICD-10-CM

## 2018-07-07 NOTE — Progress Notes (Signed)
Virtual Visit via Video Note  I connected with Ray Saunders on 07/07/18 at 10:30 AM EDT by a video enabled telemedicine application and verified that I am speaking with the correct person using two identifiers.  Location: Patient: Home Provider: Home office   I discussed the limitations of evaluation and management by telemedicine and the availability of in person appointments. The patient expressed understanding and agreed to proceed.  Urologic history: 1.  Clinical T1c very low risk prostate cancer -Biopsy 08/2017 for abnormal PSA velocity; PSA 2.5 -Prostate volume 35 g -Past 1/12 cores positive Gleason 3+3 adenocarcinoma (left lateral mid, 4%) -Elected active surveillance  History of Present Illness: 58 year old male presents for prostate cancer follow-up via video visit secondary to COVID-19 pandemic.  He states he has been doing well.  Denies dysuria, gross hematuria or flank/abdominal/pelvic/scrotal pain.  A PSA performed on 05/14/2018 was 3.4.  A prostate MRI was recommended prior to confirmatory biopsy.  This has not yet been scheduled as he was wanting to know the cost before scheduling.   Observations/Objective: Alert, no acute distress  Assessment and Plan: 58 year old male with very low risk prostate cancer.  He will be contacted regarding MRI cost.  If he elects not to have the MRI performed I have recommended scheduling a confirmatory biopsy within the next 2 months.  All of his questions were answered.  Follow Up Instructions:    I discussed the assessment and treatment plan with the patient. The patient was provided an opportunity to ask questions and all were answered. The patient agreed with the plan and demonstrated an understanding of the instructions.   The patient was advised to call back or seek an in-person evaluation if the symptoms worsen or if the condition fails to improve as anticipated.  I provided 15 minutes of non-face-to-face time during this  encounter.   Abbie Sons, MD

## 2018-08-23 ENCOUNTER — Telehealth: Payer: Self-pay | Admitting: Urology

## 2018-08-23 NOTE — Telephone Encounter (Signed)
Left message to call back to discuss MRI   [facility]

## 2018-08-23 NOTE — Telephone Encounter (Signed)
-----   Message from Abbie Sons, MD sent at 08/23/2018  7:53 AM EDT ----- Regarding: RE: Prostate MRI Will you relay this information to the patient so he can go ahead and get scheduled?  Thanks ----- Message ----- From: Benard Halsted Sent: 07/13/2018   3:39 PM EDT To: Abbie Sons, MD Subject: RE: Prostate MRI                               Yes they will but he has to schedule first so that they can run it thru his insurance.   Sharyn Lull ----- Message ----- From: Abbie Sons, MD Sent: 07/12/2018   2:48 PM EDT To: Benard Halsted Subject: RE: Prostate MRI                               He told me he did not schedule because he was waiting to hear what it was going to cost him.  Will radiology give him that information? ----- Message ----- From: Benard Halsted Sent: 07/12/2018  10:06 AM EDT To: Abbie Sons, MD Subject: RE: Prostate MRI                               I don't know anything about that but they did try and call him to schedule and he declined saying he did not want to schedule right now. I don't know how much they cost and what his insurance would pay?  ----- Message ----- From: Abbie Sons, MD Sent: 07/07/2018   3:21 PM EDT To: Benard Halsted Subject: Prostate MRI                                   I had ordered a prostate MRI and the patient stated he was holding off on scheduling until he got information on the cost.

## 2018-10-15 ENCOUNTER — Telehealth: Payer: Self-pay | Admitting: Urology

## 2018-10-15 NOTE — Telephone Encounter (Signed)
Scheduling has called him to schedule the MRI and left messages and he has not called them back to schedule.   Sharyn Lull

## 2018-10-17 ENCOUNTER — Encounter: Payer: Self-pay | Admitting: Urology

## 2018-10-17 NOTE — Telephone Encounter (Signed)
I dictated a letter to patient that is in the letter tab under chart review.  Please send certified.  Thanks

## 2018-10-18 NOTE — Telephone Encounter (Signed)
Letter sent.

## 2018-10-22 ENCOUNTER — Telehealth: Payer: Self-pay | Admitting: Urology

## 2018-10-22 NOTE — Telephone Encounter (Signed)
Patient called back and said he wanted to have the MRI done  I suggested that he go to [facility] because of the cost issue and he agreed. Sent message to scheduling to contact him today to schedule.   Sharyn Lull

## 2018-12-31 ENCOUNTER — Other Ambulatory Visit: Payer: Self-pay | Admitting: Urology

## 2019-01-03 ENCOUNTER — Ambulatory Visit
Admission: RE | Admit: 2019-01-03 | Discharge: 2019-01-03 | Disposition: A | Discharge status: Home / Self Care | Payer: BLUE CROSS/BLUE SHIELD | Source: Ambulatory Visit | Attending: Urology | Admitting: Urology

## 2019-01-03 ENCOUNTER — Other Ambulatory Visit: Payer: Self-pay | Admitting: Urology

## 2019-01-03 ENCOUNTER — Other Ambulatory Visit: Payer: Self-pay

## 2019-01-03 ENCOUNTER — Ambulatory Visit
Admission: RE | Admit: 2019-01-03 | Discharge: 2019-01-03 | Disposition: A | Discharge status: Home / Self Care | Payer: BC Managed Care – PPO | Source: Ambulatory Visit | Attending: Urology | Admitting: Urology

## 2019-01-03 DIAGNOSIS — C61 Malignant neoplasm of prostate: Secondary | ICD-10-CM

## 2019-01-03 MED ORDER — GADOBENATE DIMEGLUMINE 529 MG/ML IV SOLN
20.0000 mL | Freq: Once | INTRAVENOUS | Status: AC | PRN
  Administered 2019-01-03: 20 mL via INTRAVENOUS

## 2019-01-04 ENCOUNTER — Telehealth: Payer: Self-pay | Admitting: Urology

## 2019-01-04 DIAGNOSIS — C61 Malignant neoplasm of prostate: Secondary | ICD-10-CM

## 2019-01-04 NOTE — Telephone Encounter (Signed)
Contacted patient regarding his MRI results.  Left voicemail to call back.

## 2019-01-13 NOTE — Telephone Encounter (Signed)
Patient with low risk prostate cancer on surveillance.  He has not had a confirmatory biopsy.  Prostate MRI performed 01/03/2019 remarkable for a 46 cc gland with a PI-RADS 3 lesion in the right posterior lateral mid PZ.  MRI findings were discussed.  I recommended a MR fusion biopsy plus standard.  He is in agreement but would like to wait until after the first of the year to schedule.  The procedure was discussed could and potential risks of bleeding and infection/sepsis.

## 2019-02-24 ENCOUNTER — Telehealth: Payer: Self-pay | Admitting: Urology

## 2019-02-24 NOTE — Telephone Encounter (Signed)
Tammy @ [facility] called and wanted to let you know that she has tried several times to reach this patient to schedule his Fusion BX and he has not returned her phone calls to get this scheduled.  Not sure what you would like to do moving forward?  Please advise   Sharyn Lull

## 2019-03-08 ENCOUNTER — Encounter: Payer: Self-pay | Admitting: Urology

## 2019-03-08 NOTE — Telephone Encounter (Signed)
I will get a certified letter sent promptly.

## 2019-03-08 NOTE — Telephone Encounter (Signed)
Larene [place], would you mind dictating a letter to send certified to this patient?  He has low risk prostate cancer on active surveillance.  He has not been seen since May 2020.  I had recommended a prostate MRI and it took a while to get that done.  MRI had a PI-RADS 3 lesion.  He has not had a confirmatory biopsy.  Thanks

## 2019-07-07 ENCOUNTER — Encounter: Payer: Self-pay | Admitting: Emergency Medicine

## 2019-07-07 ENCOUNTER — Emergency Department
Admission: EM | Admit: 2019-07-07 | Discharge: 2019-07-07 | Disposition: A | Discharge status: Home / Self Care | Payer: BC Managed Care – PPO | Attending: Emergency Medicine | Admitting: Emergency Medicine

## 2019-07-07 ENCOUNTER — Other Ambulatory Visit: Payer: Self-pay

## 2019-07-07 DIAGNOSIS — Z8546 Personal history of malignant neoplasm of prostate: Secondary | ICD-10-CM | POA: Insufficient documentation

## 2019-07-07 DIAGNOSIS — I10 Essential (primary) hypertension: Secondary | ICD-10-CM | POA: Insufficient documentation

## 2019-07-07 DIAGNOSIS — Z79899 Other long term (current) drug therapy: Secondary | ICD-10-CM | POA: Diagnosis not present

## 2019-07-07 DIAGNOSIS — N410 Acute prostatitis: Secondary | ICD-10-CM | POA: Diagnosis not present

## 2019-07-07 DIAGNOSIS — Z87891 Personal history of nicotine dependence: Secondary | ICD-10-CM | POA: Diagnosis not present

## 2019-07-07 DIAGNOSIS — N489 Disorder of penis, unspecified: Secondary | ICD-10-CM

## 2019-07-07 DIAGNOSIS — N509 Disorder of male genital organs, unspecified: Secondary | ICD-10-CM | POA: Insufficient documentation

## 2019-07-07 DIAGNOSIS — R1030 Lower abdominal pain, unspecified: Secondary | ICD-10-CM | POA: Diagnosis present

## 2019-07-07 LAB — CBC
HCT: 48.5 % (ref 39.0–52.0)
Hemoglobin: 16.7 g/dL (ref 13.0–17.0)
MCH: 30.1 pg (ref 26.0–34.0)
MCHC: 34.4 g/dL (ref 30.0–36.0)
MCV: 87.5 fL (ref 80.0–100.0)
Platelets: 248 10*3/uL (ref 150–400)
RBC: 5.54 MIL/uL (ref 4.22–5.81)
RDW: 12.9 % (ref 11.5–15.5)
WBC: 8.7 10*3/uL (ref 4.0–10.5)
nRBC: 0 % (ref 0.0–0.2)

## 2019-07-07 LAB — URINALYSIS, COMPLETE (UACMP) WITH MICROSCOPIC
Bacteria, UA: NONE SEEN
Bilirubin Urine: NEGATIVE
Glucose, UA: NEGATIVE mg/dL
Ketones, ur: NEGATIVE mg/dL
Leukocytes,Ua: NEGATIVE
Nitrite: NEGATIVE
Protein, ur: NEGATIVE mg/dL
Specific Gravity, Urine: 1.002 — ABNORMAL LOW (ref 1.005–1.030)
pH: 6 (ref 5.0–8.0)

## 2019-07-07 LAB — CHLAMYDIA/NGC RT PCR (ARMC ONLY)
Chlamydia Tr: NOT DETECTED
N gonorrhoeae: NOT DETECTED

## 2019-07-07 LAB — COMPREHENSIVE METABOLIC PANEL
ALT: 29 U/L (ref 0–44)
AST: 25 U/L (ref 15–41)
Albumin: 4.5 g/dL (ref 3.5–5.0)
Alkaline Phosphatase: 63 U/L (ref 38–126)
Anion gap: 8 (ref 5–15)
BUN: 13 mg/dL (ref 6–20)
CO2: 26 mmol/L (ref 22–32)
Calcium: 9.4 mg/dL (ref 8.9–10.3)
Chloride: 102 mmol/L (ref 98–111)
Creatinine, Ser: 1.37 mg/dL — ABNORMAL HIGH (ref 0.61–1.24)
GFR calc Af Amer: >60 mL/min (ref >60)
GFR calc non Af Amer: 56 mL/min — ABNORMAL LOW (ref >60)
Glucose, Bld: 121 mg/dL — ABNORMAL HIGH (ref 70–99)
Potassium: 3.6 mmol/L (ref 3.5–5.1)
Sodium: 136 mmol/L (ref 135–145)
Total Bilirubin: 0.9 mg/dL (ref 0.3–1.2)
Total Protein: 8 g/dL (ref 6.5–8.1)

## 2019-07-07 LAB — LIPASE, BLOOD: Lipase: 33 U/L (ref 11–51)

## 2019-07-07 MED ORDER — CIPROFLOXACIN HCL 500 MG PO TABS
500.0000 mg | ORAL_TABLET | Freq: Two times a day (BID) | ORAL | 0 refills | Status: AC
Start: 2019-07-07 — End: 2019-07-21

## 2019-07-07 MED ORDER — IBUPROFEN 600 MG PO TABS
600.0000 mg | ORAL_TABLET | Freq: Three times a day (TID) | ORAL | 0 refills | Status: DC | PRN
Start: 2019-07-07 — End: 2019-11-18

## 2019-07-07 MED ORDER — HYDROCODONE-ACETAMINOPHEN 5-325 MG PO TABS: 1.0000 | ORAL_TABLET | Freq: Four times a day (QID) | ORAL | 0 refills | Status: DC | PRN

## 2019-07-07 MED ORDER — LIDOCAINE HCL (PF) 1 % IJ SOLN
5.0000 mL | Freq: Once | INTRAMUSCULAR | Status: AC
  Administered 2019-07-07: 5 mL
  Filled 2019-07-07: qty 5

## 2019-07-07 MED ORDER — HYDROCODONE-ACETAMINOPHEN 5-325 MG PO TABS
2.0000 | ORAL_TABLET | Freq: Once | ORAL | Status: AC
  Administered 2019-07-07: 2 via ORAL
  Filled 2019-07-07: qty 2

## 2019-07-07 MED ORDER — SODIUM CHLORIDE 0.9% FLUSH: 3.0000 mL | Freq: Once | INTRAVENOUS | Status: DC

## 2019-07-07 MED ORDER — AZITHROMYCIN 500 MG PO TABS
1000.0000 mg | ORAL_TABLET | Freq: Once | ORAL | Status: AC
  Administered 2019-07-07: 1000 mg via ORAL
  Filled 2019-07-07: qty 2

## 2019-07-07 MED ORDER — AZITHROMYCIN 1 G PO PACK: 1.0000 g | PACK | Freq: Once | ORAL | Status: DC

## 2019-07-07 MED ORDER — CEFTRIAXONE SODIUM 250 MG IJ SOLR
250.0000 mg | Freq: Once | INTRAMUSCULAR | Status: AC
  Administered 2019-07-07: 250 mg via INTRAMUSCULAR
  Filled 2019-07-07: qty 250

## 2019-07-07 NOTE — ED Notes (Signed)
See triage note  Presents with lower back pain  States pain is worse on the right  Moves around to groin

## 2019-07-07 NOTE — Discharge Instructions (Signed)
Take the Ibuprofen for moderate pain, and the Norco for severe pain  Wear tighter-fitting underwear to help support your testicles  Take the full course of antibiotics as prescribed. It's important to complete the longer course to make sure infection goes away.  We will call you if any other results come back positive.  Follow-up with Urology in the next few weeks

## 2019-07-07 NOTE — ED Provider Notes (Signed)
[facility] Emergency Department Provider Note  ____________________________________________   First MD Initiated Contact with Patient 07/07/19 1214     (approximate)  I have reviewed the triage vital signs and the nursing notes.   HISTORY  Chief Complaint Back Pain and Groin Pain    HPI Ray Saunders is a 59 y.o. male with past medical history of prostate enlargement, prior alcohol abuse, here with lower abdominal and back pain.  The patient states his pain began gradually yesterday as moderate, aching, throbbing, right lower back pain.  This pain radiated down towards his right groin.  He has had some mild pain in his bilateral posterior testes as well as perineal area.  He is also had some urinary urgency, frequency, and burning.  Also had a small sore appeared on the tip of his penis today.  Disorder is mildly tender, localized around his urethra.  Denies any high risk sexual activity.  Sexual active with his wife and does not use protection.  Denies concern for STD.  No fevers or chills.  No systemic symptoms.  No other acute complaints.        Past Medical History:  Diagnosis Date  . Alcohol abuse   . Elevated PSA   . Elevated PSA   . GERD (gastroesophageal reflux disease)   . History of kidney stones   . HLD (hyperlipidemia)   . Hypertension     Patient Active Problem List   Diagnosis Date Noted  . Prostate cancer ([place]) 07/07/2018  . Chest pain at rest 07/07/2017  . Dyspnea on exertion 07/07/2017  . Rising PSA level 10/17/2014  . BP (high blood pressure) 08/24/2014  . Abnormal prostate specific antigen 09/05/2013  . HLD (hyperlipidemia) 09/05/2013    Past Surgical History:  Procedure Laterality Date  . COLONOSCOPY WITH PROPOFOL N/A 10/09/2014   Procedure: COLONOSCOPY WITH PROPOFOL;  Surgeon: Lollie Sails, MD;  Location: [facility] ENDOSCOPY;  Service: Endoscopy;  Laterality: N/A;    Prior to Admission medications   Medication Sig  Start Date End Date Taking? Authorizing Provider  ciprofloxacin (CIPRO) 500 MG tablet Take 1 tablet (500 mg total) by mouth 2 (two) times daily for 14 days. 07/07/19 07/21/19  [name], MD  HYDROcodone-acetaminophen (NORCO/VICODIN) 5-325 MG tablet Take 1-2 tablets by mouth every 6 (six) hours as needed for moderate pain or severe pain. 07/07/19 07/06/20  [name], MD  ibuprofen (ADVIL) 600 MG tablet Take 1 tablet (600 mg total) by mouth every 8 (eight) hours as needed for moderate pain. 07/07/19   [name], MD  lisinopril-hydrochlorothiazide (PRINZIDE,ZESTORETIC) 20-12.5 MG tablet TAKE ONE (1) TABLET EACH DAY 12/23/17   [provider]    Allergies Patient has no known allergies.  Family History  Problem Relation Age of Onset  . Kidney failure Mother        transplant  . Prostate cancer Maternal Grandfather   . Bladder Cancer Neg Hx     Social History Social History   Tobacco Use  . Smoking status: Former Smoker    Types: Cigarettes    Quit date: 08/24/1987    Years since quitting: 31.8  . Smokeless tobacco: Never Used  Substance Use Topics  . Alcohol use: No    Alcohol/week: 0.0 standard drinks  . Drug use: No    Review of Systems  Review of Systems  Constitutional: Positive for fatigue. Negative for chills and fever.  HENT: Negative for sore throat.   Respiratory: Negative for shortness of  breath.   Cardiovascular: Negative for chest pain.  Gastrointestinal: Negative for abdominal pain.  Genitourinary: Positive for dysuria, frequency and testicular pain. Negative for flank pain.  Musculoskeletal: Negative for neck pain.  Skin: Negative for rash and wound.  Allergic/Immunologic: Negative for immunocompromised state.  Neurological: Negative for weakness and numbness.  Hematological: Does not bruise/bleed easily.  All other systems reviewed and are negative.    ____________________________________________  PHYSICAL EXAM:      VITAL  SIGNS: ED Triage Vitals  Enc Vitals Group     BP 07/07/19 0925 (!) 156/87     Pulse Rate 07/07/19 0925 79     Resp 07/07/19 0925 16     Temp 07/07/19 0925 98.4 F (36.9 C)     Temp Source 07/07/19 0925 Oral     SpO2 07/07/19 0925 99 %     Weight 07/07/19 0921 213 lb 10 oz (96.9 kg)     Height 07/07/19 0921 5\' 6"  (1.676 m)     Head Circumference --      Peak Flow --      Pain Score 07/07/19 0920 8     Pain Loc --      Pain Edu? --      Excl. in [place]? --      Physical Exam Vitals and nursing note reviewed.  Constitutional:      General: He is not in acute distress.    Appearance: He is well-developed.  HENT:     Head: Normocephalic and atraumatic.  Eyes:     Conjunctiva/sclera: Conjunctivae normal.  Cardiovascular:     Rate and Rhythm: Normal rate and regular rhythm.     Heart sounds: Normal heart sounds.  Pulmonary:     Effort: Pulmonary effort is normal. No respiratory distress.     Breath sounds: No wheezing.  Abdominal:     General: There is no distension.  Genitourinary:    Comments: Testes descended bilaterally.  Mild posterior tenderness to the left greater than right testes.  There is a superficial, mildly erythematous area raised to the right of the urethra.  No apparent vesicles.  No other skin lesions.  No other rashes.  Mild tenderness noted.  No discharge or drainage. Cremasterics intact bilaterally. Musculoskeletal:     Cervical back: Neck supple.  Skin:    General: Skin is warm.     Capillary Refill: Capillary refill takes less than 2 seconds.     Findings: No rash.  Neurological:     Mental Status: He is alert and oriented to person, place, and time.     Motor: No abnormal muscle tone.       ____________________________________________   LABS (all labs ordered are listed, but only abnormal results are displayed)  Labs Reviewed  COMPREHENSIVE METABOLIC PANEL - Abnormal; Notable for the following components:      Result Value   Glucose, Bld 121  (*)    Creatinine, Ser 1.37 (*)    GFR calc non Af Amer 56 (*)    All other components within normal limits  URINALYSIS, COMPLETE (UACMP) WITH MICROSCOPIC - Abnormal; Notable for the following components:   Color, Urine COLORLESS (*)    APPearance CLEAR (*)    Specific Gravity, Urine 1.002 (*)    Hgb urine dipstick MODERATE (*)    All other components within normal limits  URINE CULTURE  CHLAMYDIA/NGC RT PCR ([facility] ONLY)  LIPASE, BLOOD  CBC  RPR    ____________________________________________  EKG: none ________________________________________  RADIOLOGY All imaging, including plain films, CT scans, and ultrasounds, independently reviewed by me, and interpretations confirmed via formal radiology reads.  ED MD interpretation:   None  Official radiology report(s): No results found.  ____________________________________________  PROCEDURES   Procedure(s) performed (including Critical Care):  Procedures  ____________________________________________  INITIAL IMPRESSION / MDM / [place] / ED COURSE  As part of my medical decision making, I reviewed the following data within the [facility] notes reviewed and incorporated, Old chart reviewed, Notes from prior ED visits, and [place] Controlled Substance Database       *Ray Saunders was evaluated in Emergency Department on 07/07/2019 for the symptoms described in the history of present illness. He was evaluated in the context of the global COVID-19 pandemic, which necessitated consideration that the patient might be at risk for infection with the SARS-CoV-2 virus that causes COVID-19. Institutional protocols and algorithms that pertain to the evaluation of patients at risk for COVID-19 are in a state of rapid change based on information released by regulatory bodies including the CDC and federal and state organizations. These policies and algorithms were followed during the patient's  care in the ED.  Some ED evaluations and interventions may be delayed as a result of limited staffing during the pandemic.*     Medical Decision Making:  59 yo M with PMHx BPH here with lower back pain, urinary urgency, mild perineal and posterior testicular pain. Exam, history is most c/w prostatitis with urgency, frequency, perineal pain, low back pain and normal AU. No fever, leukocytosis, or signs of sepsis. Renal function is acceptable. Testicular exam as above - normal lie, normal cremasterics and no signs of torsion or scrotal or testicular abscess or orchitis. No inguinal hernias. His penile lesion is nondescript but ddx includes mild cellulitis/abrasion, less likely chancre. Lesion is not c/w herpes. Will check RPR, GC/C, tx empirically for possible STD/STI, and d/c on cipro for prostatitis. Defers prostate exam. Return precautions given.  ____________________________________________  FINAL CLINICAL IMPRESSION(S) / ED DIAGNOSES  Final diagnoses:  Acute prostatitis  Penile lesion     MEDICATIONS GIVEN DURING THIS VISIT:  Medications  sodium chloride flush (NS) 0.9 % injection 3 mL (3 mLs Intravenous Not Given 07/07/19 1216)  cefTRIAXone (ROCEPHIN) injection 250 mg (250 mg Intramuscular Given 07/07/19 1315)  HYDROcodone-acetaminophen (NORCO/VICODIN) 5-325 MG per tablet 2 tablet (2 tablets Oral Given 07/07/19 1314)  azithromycin (ZITHROMAX) tablet 1,000 mg (1,000 mg Oral Given 07/07/19 1315)  lidocaine (PF) (XYLOCAINE) 1 % injection 5 mL (5 mLs Other Given 07/07/19 1315)     ED Discharge Orders         Ordered    ciprofloxacin (CIPRO) 500 MG tablet  2 times daily     07/07/19 1307    HYDROcodone-acetaminophen (NORCO/VICODIN) 5-325 MG tablet  Every 6 hours PRN     07/07/19 1307    ibuprofen (ADVIL) 600 MG tablet  Every 8 hours PRN     07/07/19 1307           Note:  This document was prepared using Dragon voice recognition software and may include unintentional dictation  errors.   [name], MD 07/07/19 1413

## 2019-07-07 NOTE — ED Triage Notes (Signed)
C/O low back pain, groin lower abdominal pain, and urgency, frequency, and burning with urination.  Sent from [facility] for ED evaluation. AAOx3.  Skin warm and dry. NAD

## 2019-07-08 LAB — RPR: RPR Ser Ql: NONREACTIVE

## 2019-07-09 LAB — URINE CULTURE: Culture: NO GROWTH

## 2019-07-29 ENCOUNTER — Other Ambulatory Visit: Payer: BC Managed Care – PPO

## 2019-07-29 ENCOUNTER — Other Ambulatory Visit: Payer: Self-pay

## 2019-07-29 DIAGNOSIS — C61 Malignant neoplasm of prostate: Secondary | ICD-10-CM

## 2019-07-30 LAB — PSA: Prostate Specific Ag, Serum: 3.2 ng/mL (ref 0.0–4.0)

## 2019-08-05 ENCOUNTER — Encounter: Payer: Self-pay | Admitting: Urology

## 2019-08-05 ENCOUNTER — Other Ambulatory Visit: Payer: Self-pay

## 2019-08-05 ENCOUNTER — Ambulatory Visit: Payer: BC Managed Care – PPO | Admitting: Urology

## 2019-08-05 VITALS — BP 148/87 | HR 75 | Ht 66.0 in | Wt 215.0 lb

## 2019-08-05 DIAGNOSIS — C61 Malignant neoplasm of prostate: Secondary | ICD-10-CM | POA: Diagnosis not present

## 2019-08-05 NOTE — Progress Notes (Signed)
08/05/2019 8:33 AM   Ray Saunders [birth year] [id number]  Referring provider: Sofie Hartigan, MD [place],  [place] [address]  Chief Complaint  Patient presents with  . Prostate Biopsy    Urologic history: 1.Clinical T1c very low risk prostate cancer -Biopsy 08/2017 for abnormal PSA velocity; PSA 2.5 -Prostate volume 35 g -Past 1/12 cores positive Gleason 3+3 adenocarcinoma (left lateral mid, 4%) -Elected active surveillance  2.  Nephrolithiasis   HPI: 59 y.o. male presents for follow-up of prostate cancer on active surveillance.  -Prostate MRI 12/2018 with prostate volume 46 mL and a PI-RADS 3 lesion right posterolateral mid PZ -Fusion biopsy was recommended however could not get in touch to schedule -Subsequently sent a certified letter -He has mild urinary frequency -Denies dysuria, gross hematuria -Seen in the ED last month for groin and low back pain which has resolved -PSA 07/29/2019 stable at 3.2  PMH: Past Medical History:  Diagnosis Date  . Alcohol abuse   . Elevated PSA   . Elevated PSA   . GERD (gastroesophageal reflux disease)   . History of kidney stones   . HLD (hyperlipidemia)   . Hypertension     Surgical History: Past Surgical History:  Procedure Laterality Date  . COLONOSCOPY WITH PROPOFOL N/A 10/09/2014   Procedure: COLONOSCOPY WITH PROPOFOL;  Surgeon: Lollie Sails, MD;  Location: [facility] ENDOSCOPY;  Service: Endoscopy;  Laterality: N/A;    Home Medications:  Allergies as of 08/05/2019   No Known Allergies     Medication List       Accurate as of August 05, 2019  8:33 AM. If you have any questions, ask your nurse or doctor.        HYDROcodone-acetaminophen 5-325 MG tablet Commonly known as: NORCO/VICODIN Take 1-2 tablets by mouth every 6 (six) hours as needed for moderate pain or severe pain.   ibuprofen 600 MG tablet Commonly known as: ADVIL Take 1 tablet (600 mg total) by mouth every 8 (eight) hours as  needed for moderate pain.   lisinopril-hydrochlorothiazide 20-12.5 MG tablet Commonly known as: ZESTORETIC TAKE ONE (1) TABLET EACH DAY       Allergies: No Known Allergies  Family History: Family History  Problem Relation Age of Onset  . Kidney failure Mother        transplant  . Prostate cancer Maternal Grandfather   . Bladder Cancer Neg Hx     Social History:  reports that he quit smoking about 31 years ago. His smoking use included cigarettes. He has never used smokeless tobacco. He reports that he does not drink alcohol and does not use drugs.   Physical Exam: BP (!) 148/87   Pulse 75   Ht 5\' 6"  (1.676 m)   Wt 215 lb (97.5 kg)   BMI 34.70 kg/m   Constitutional:  Alert and oriented, No acute distress. HEENT: [place] AT, moist mucus membranes.  Trachea midline, no masses. Cardiovascular: No clubbing, cyanosis, or edema. Respiratory: Normal respiratory effort, no increased work of breathing. Skin: No rashes, bruises or suspicious lesions. Neurologic: Grossly intact, no focal deficits, moving all 4 extremities. Psychiatric: Normal mood and affect.    Assessment & Plan:    1.  Prostate cancer -Low risk on active surveillance -Has not had confirmatory biopsy -MRI November 2020 with PI-RADS 3 lesion -MRI findings discussed and have recommended confirmatory fusion biopsy   Abbie Sons, MD  [facility] [address], [place], [place] [address] [phone]  [phone]  

## 2019-08-06 ENCOUNTER — Other Ambulatory Visit: Payer: Self-pay | Admitting: Urology

## 2019-08-06 DIAGNOSIS — C61 Malignant neoplasm of prostate: Secondary | ICD-10-CM

## 2019-09-26 ENCOUNTER — Other Ambulatory Visit: Payer: Self-pay | Admitting: Urology

## 2019-10-10 ENCOUNTER — Other Ambulatory Visit: Payer: Self-pay

## 2019-10-10 ENCOUNTER — Encounter: Payer: Self-pay | Admitting: Emergency Medicine

## 2019-10-10 ENCOUNTER — Ambulatory Visit
Admission: EM | Admit: 2019-10-10 | Discharge: 2019-10-10 | Disposition: A | Discharge status: Home / Self Care | Payer: BC Managed Care – PPO | Attending: Physician Assistant | Admitting: Physician Assistant

## 2019-10-10 DIAGNOSIS — Z20822 Contact with and (suspected) exposure to covid-19: Secondary | ICD-10-CM | POA: Diagnosis not present

## 2019-10-10 DIAGNOSIS — R0981 Nasal congestion: Secondary | ICD-10-CM

## 2019-10-10 DIAGNOSIS — R05 Cough: Secondary | ICD-10-CM | POA: Diagnosis not present

## 2019-10-10 DIAGNOSIS — Z87891 Personal history of nicotine dependence: Secondary | ICD-10-CM | POA: Diagnosis not present

## 2019-10-10 DIAGNOSIS — K219 Gastro-esophageal reflux disease without esophagitis: Secondary | ICD-10-CM | POA: Insufficient documentation

## 2019-10-10 DIAGNOSIS — E785 Hyperlipidemia, unspecified: Secondary | ICD-10-CM | POA: Diagnosis not present

## 2019-10-10 DIAGNOSIS — J029 Acute pharyngitis, unspecified: Secondary | ICD-10-CM | POA: Insufficient documentation

## 2019-10-10 DIAGNOSIS — J069 Acute upper respiratory infection, unspecified: Secondary | ICD-10-CM | POA: Insufficient documentation

## 2019-10-10 DIAGNOSIS — R059 Cough, unspecified: Secondary | ICD-10-CM

## 2019-10-10 DIAGNOSIS — I10 Essential (primary) hypertension: Secondary | ICD-10-CM | POA: Diagnosis not present

## 2019-10-10 LAB — GROUP A STREP BY PCR: Group A Strep by PCR: NOT DETECTED

## 2019-10-10 LAB — SARS CORONAVIRUS 2 AG (30 MIN TAT): SARS Coronavirus 2 Ag: NEGATIVE

## 2019-10-10 MED ORDER — BENZONATATE 100 MG PO CAPS: 200.0000 mg | ORAL_CAPSULE | Freq: Three times a day (TID) | ORAL | 0 refills | Status: AC | PRN

## 2019-10-10 MED ORDER — FLUTICASONE PROPIONATE 50 MCG/ACT NA SUSP
1.0000 | Freq: Two times a day (BID) | NASAL | 0 refills | Status: DC
Start: 2019-10-10 — End: 2019-11-24

## 2019-10-10 NOTE — ED Triage Notes (Addendum)
Pt c/o sore throat, sinus congestion, runny nose, and cough. Started about 2 days ago. Denies fever. Declines covid testing he states he gets a sinus infection every year. He has not had covid vaccines.

## 2019-10-10 NOTE — ED Provider Notes (Signed)
[facility]    CSN: [id number] Arrival date & time: 10/10/19  1101      History   Chief Complaint Chief Complaint  Patient presents with  . Sore Throat  . Sinus Problem    HPI Ray Saunders is a 59 y.o. male.   58 y/o male presents for 2 day history of sore throat, sinus congestion, productive cough. Denies fever, fatigue, body aches. Patient says he gets sinus infections yearly and believes  He may have a sinus infection. States he does not feel that bad. He says he has taken decongestants with some improvement in symptoms. Denies known COVID exposure. Denies having COVID vaccine.   Patient says he has hypertension and has not taken his BP medication today. States he had another doctors appt this morning and BP was 140s/90s. He has no other concerns today.     Past Medical History:  Diagnosis Date  . Alcohol abuse   . Elevated PSA   . Elevated PSA   . GERD (gastroesophageal reflux disease)   . History of kidney stones   . HLD (hyperlipidemia)   . Hypertension     Patient Active Problem List   Diagnosis Date Noted  . Prostate cancer ([place]) 07/07/2018  . Chest pain at rest 07/07/2017  . Dyspnea on exertion 07/07/2017  . Rising PSA level 10/17/2014  . BP (high blood pressure) 08/24/2014  . Abnormal prostate specific antigen 09/05/2013  . HLD (hyperlipidemia) 09/05/2013    Past Surgical History:  Procedure Laterality Date  . COLONOSCOPY WITH PROPOFOL N/A 10/09/2014   Procedure: COLONOSCOPY WITH PROPOFOL;  Surgeon: Lollie Sails, MD;  Location[facility] ENDOSCOPY;  Service: Endoscopy;  Laterality: N/A;       Home Medications    Prior to Admission medications   Medication Sig Start Date End Date Taking? Authorizing Provider  lisinopril-hydrochlorothiazide (PRINZIDE,ZESTORETIC) 20-12.5 MG tablet TAKE ONE (1) TABLET EACH DAY 12/23/17  Yes [provider]  benzonatate (TESSALON) 100 MG capsule Take 2 capsules (200 mg total) by mouth 3  (three) times daily as needed for up to 7 days for cough. 10/10/19 10/17/19  [name], PA-C  fluticasone (FLONASE) 50 MCG/ACT nasal spray Place 1 spray into both nostrils in the morning and at bedtime for 7 days. 10/10/19 10/17/19  [name], PA-C  HYDROcodone-acetaminophen (NORCO/VICODIN) 5-325 MG tablet Take 1-2 tablets by mouth every 6 (six) hours as needed for moderate pain or severe pain. 07/07/19 07/06/20  [name], MD  ibuprofen (ADVIL) 600 MG tablet Take 1 tablet (600 mg total) by mouth every 8 (eight) hours as needed for moderate pain. 07/07/19   [name], MD    Family History Family History  Problem Relation Age of Onset  . Kidney failure Mother        transplant  . Prostate cancer Maternal Grandfather   . Bladder Cancer Neg Hx     Social History Social History   Tobacco Use  . Smoking status: Former Smoker    Types: Cigarettes    Quit date: 08/24/1987    Years since quitting: 32.1  . Smokeless tobacco: Never Used  Vaping Use  . Vaping Use: Never assessed  Substance Use Topics  . Alcohol use: No    Alcohol/week: 0.0 standard drinks  . Drug use: No     Allergies   Patient has no known allergies.   Review of Systems Review of Systems  Constitutional: Negative for fatigue and fever.  HENT: Positive for congestion  and sore throat. Negative for rhinorrhea, sinus pressure and sinus pain.   Respiratory: Positive for cough. Negative for shortness of breath.   Gastrointestinal: Negative for abdominal pain, diarrhea, nausea and vomiting.  Musculoskeletal: Negative for myalgias.  Neurological: Negative for weakness, light-headedness and headaches.  Hematological: Negative for adenopathy.     Physical Exam Triage Vital Signs ED Triage Vitals  Enc Vitals Group     BP 10/10/19 1130 (!) 175/94     Pulse Rate 10/10/19 1130 80     Resp 10/10/19 1130 18     Temp 10/10/19 1130 98.7 F (37.1 C)     Temp Source 10/10/19 1130 Oral     SpO2 10/10/19  1130 100 %     Weight 10/10/19 1127 214 lb 15.2 oz (97.5 kg)     Height 10/10/19 1127 5\' 6"  (1.676 m)     Head Circumference --      Peak Flow --      Pain Score 10/10/19 1127 0     Pain Loc --      Pain Edu? --      Excl. in [place]? --    No data found.  Updated Vital Signs BP (!) 147/92 (BP Location: Right Arm)   Pulse 80   Temp 98.7 F (37.1 C) (Oral)   Resp 18   Ht 5\' 6"  (1.676 m)   Wt 214 lb 15.2 oz (97.5 kg)   SpO2 100%   BMI 34.69 kg/m      Physical Exam Vitals and nursing note reviewed.  Constitutional:      General: He is not in acute distress.    Appearance: Normal appearance. He is normal weight. He is not toxic-appearing.  HENT:     Head: Normocephalic and atraumatic.     Right Ear: Tympanic membrane, ear canal and external ear normal.     Left Ear: Tympanic membrane, ear canal and external ear normal.     Nose: Congestion present.     Right Sinus: No maxillary sinus tenderness or frontal sinus tenderness.     Left Sinus: No maxillary sinus tenderness or frontal sinus tenderness.     Mouth/Throat:     Pharynx: Posterior oropharyngeal erythema (mild erythema with clear PND) present.  Eyes:     General: No scleral icterus.    Conjunctiva/sclera: Conjunctivae normal.  Cardiovascular:     Rate and Rhythm: Normal rate and regular rhythm.     Heart sounds: Normal heart sounds.  Pulmonary:     Effort: Pulmonary effort is normal. No respiratory distress.     Breath sounds: Normal breath sounds.  Abdominal:     Tenderness: There is no right CVA tenderness or left CVA tenderness.  Musculoskeletal:     Cervical back: Normal range of motion and neck supple.  Lymphadenopathy:     Cervical: Cervical adenopathy present.  Skin:    General: Skin is dry.  Neurological:     General: No focal deficit present.     Mental Status: He is alert. Mental status is at baseline.     Motor: No weakness.     Coordination: Coordination normal.     Gait: Gait normal.    Psychiatric:        Mood and Affect: Mood normal.        Behavior: Behavior normal.        Thought Content: Thought content normal.      UC Treatments / Results  Labs (all labs ordered are listed, but  only abnormal results are displayed) Labs Reviewed  GROUP A STREP BY PCR  SARS CORONAVIRUS 2 AG (30 MIN TAT)    EKG   Radiology No results found.  Procedures Procedures (including critical care time)  Medications Ordered in UC Medications - No data to display  Initial Impression / Assessment and Plan / UC Course  I have reviewed the triage vital signs and the nursing notes.  Pertinent labs & imaging results that were available during my care of the patient were reviewed by me and considered in my medical decision making (see chart for details).   Negative RST and rapid COVID testing today Exam and symptoms consistent with viral URI. Encouraged against antibiotic use at this time. Pt agreeable. Sent rx for benzonatate and flonase to pharmacy. Advised supportive at home care and f/u as needed for new/worsening symptoms.   Also encouraged keeping BP check and f/u with PCP if consistently elevated.   Final Clinical Impressions(s) / UC Diagnoses   Final diagnoses:  Pharyngitis, unspecified etiology  Nasal congestion  Cough  Viral upper respiratory tract infection     Discharge Instructions     -We have performed a COVID rapid test and rapid strep test today. Both tests are negative -Your symptoms are consistent with a viral illness/cold -Continue decongestants and your symptoms should resolve over the next 7-10 days. If you have a fever, increased sore throat, sinus pain, worsening cough, breathing trouble, follow back up with [place]. Antibiotics are not indicated at this time -BP elevated in clinic. Keep a check on that at home and follow up with your PCP if consistently having BP >140/90      ED Prescriptions    Medication Sig Dispense Auth. Provider   benzonatate  (TESSALON) 100 MG capsule Take 2 capsules (200 mg total) by mouth 3 (three) times daily as needed for up to 7 days for cough. 21 capsule [name], PA-C   fluticasone (FLONASE) 50 MCG/ACT nasal spray Place 1 spray into both nostrils in the morning and at bedtime for 7 days. 1 g [name], PA-C     PDMP not reviewed this encounter.   [name], PA-C 10/11/19 1711

## 2019-10-10 NOTE — Discharge Instructions (Addendum)
-  We have performed a COVID rapid test and rapid strep test today. Both tests are negative -Your symptoms are consistent with a viral illness/cold -Continue decongestants and your symptoms should resolve over the next 7-10 days. If you have a fever, increased sore throat, sinus pain, worsening cough, breathing trouble, follow back up with [place]. Antibiotics are not indicated at this time -BP elevated in clinic. Keep a check on that at home and follow up with your PCP if consistently having BP >140/90

## 2019-10-17 ENCOUNTER — Telehealth: Payer: Self-pay | Admitting: Urology

## 2019-10-17 NOTE — Telephone Encounter (Signed)
Pt is returning  a call to Dr. Bernardo Heater

## 2019-10-18 NOTE — Telephone Encounter (Signed)
if patient calls back he needs an app with Dr. Bernardo Heater for a telephone app for his fusion bx results we have been trying to reach him for weeks and can never get him on the phone he said that he had tried calling him so now he has to have an app over the phone scheduled.  Sharyn Lull

## 2019-10-21 ENCOUNTER — Telehealth (INDEPENDENT_AMBULATORY_CARE_PROVIDER_SITE_OTHER): Payer: BC Managed Care – PPO | Admitting: Urology

## 2019-10-21 ENCOUNTER — Other Ambulatory Visit: Payer: Self-pay

## 2019-10-21 DIAGNOSIS — C61 Malignant neoplasm of prostate: Secondary | ICD-10-CM

## 2019-10-23 ENCOUNTER — Encounter: Payer: Self-pay | Admitting: Urology

## 2019-10-23 NOTE — Progress Notes (Signed)
Virtual Visit via Telephone Note  I connected with Ray Saunders on 10/23/19 at  2:15 PM EDT by telephone and verified that I am speaking with the correct person using two identifiers.  Location: Patient: Work Provider: [place]   I discussed the limitations, risks, security and privacy concerns of performing an evaluation and management service by telephone and the availability of in person appointments. I also discussed with the patient that there may be a patient responsible charge related to this service. The patient expressed understanding and agreed to proceed.  Urologic history: 1.Clinical T1c very low risk prostate cancer -Biopsy 08/2017 for abnormal PSA velocity; PSA 2.5 -Prostate volume 35 g -Past 1/12 cores positive Gleason 3+3 adenocarcinoma (left lateral mid, 4%) -Elected active surveillance   History of Present Illness: 59 y.o. male with history low risk prostate cancer on active surveillance   Prostate MRI performed prior to fusion biopsy showed PI-RADS 5 lesion  Fusion biopsy performed [place] [facility] 09/23/[facility]  Prostate volume 45 g, standard template biopsy and 4 ROI biopsies performed  Pathology 2/4 ROI biopsies positive Gleason 3+3 adenocarcinoma; 4/12 biopsies positive for Gleason 3+3 adenocarcinoma with tissue involvement ranging 10-90%; 1/12 core positive Gleason 3+4 adenocarcinoma (30%)    Observations/Objective: Alert, conversive  Assessment and Plan:  Pathology report was discussed in detail.  Significant increase volume Gleason 3+3 adenocarcinoma and 1 core Gleason 3+4 adenocarcinoma  Based on upstage to intermediate risk disease and age would recommend curative treatment  We discussed both radical prostatectomy and radiation modalities.  He would like to think over these options and indicated he would call back with his decision  Follow Up Instructions:  I discussed the assessment and treatment plan with the patient. The  patient was provided an opportunity to ask questions and all were answered. The patient agreed with the plan and demonstrated an understanding of the instructions.   The patient was advised to call back or seek an in-person evaluation if the symptoms worsen or if the condition fails to improve as anticipated.  I provided 15 minutes of non-face-to-face time during this encounter.   Abbie Sons, MD

## 2019-11-14 ENCOUNTER — Ambulatory Visit: Payer: BC Managed Care – PPO | Admitting: Urology

## 2019-11-17 NOTE — Progress Notes (Signed)
11/18/2019 8:42 AM   [name] [birth year] [id number]  Referring provider: Sofie Hartigan, MD [place],  [place] [id number] Chief Complaint  Patient presents with  . Prostate Cancer   Urologic history: 1.Clinical T1c very low risk prostate cancer -Biopsy 08/2017 for abnormal PSA velocity; PSA 2.5 -Prostate volume 35 g -Past 1/12 cores positive Gleason 3+3 adenocarcinoma (left lateral mid, 4%) -Elected active surveillance -MRI prior to confirmatory biopsy 46 g prostate; PI-RADS 3 lesion R PZ -MR fusion biopsy 08/27/2019 2/4 ROI biopsies positive Gleason 3+3 adenocarcinoma; 4/12 biopsies positive for Gleason 3+3 adenocarcinoma with tissue involvement ranging 10-90%; 1/12 core positive Gleason 3+4 adenocarcinoma (30%)  HPI: Ray Saunders is a 59 y.o. male who presents today for discussion regarding management of T1c intermediate risk prostate cancer. He is accompanied by his wife today.    Options discussed via telephone note 10/21/2019  He wanted to bring in his wife today for further discussion  No complaints   PMH: Past Medical History:  Diagnosis Date  . Alcohol abuse   . Elevated PSA   . Elevated PSA   . GERD (gastroesophageal reflux disease)   . History of kidney stones   . HLD (hyperlipidemia)   . Hypertension     Surgical History: Past Surgical History:  Procedure Laterality Date  . COLONOSCOPY WITH PROPOFOL N/A 10/09/2014   Procedure: COLONOSCOPY WITH PROPOFOL;  Surgeon: Lollie Sails, MD;  Location: [facility] ENDOSCOPY;  Service: Endoscopy;  Laterality: N/A;    Home Medications:  Allergies as of 11/18/2019   No Known Allergies     Medication List       Accurate as of November 18, 2019  8:42 AM. If you have any questions, ask your nurse or doctor.        STOP taking these medications   HYDROcodone-acetaminophen 5-325 MG tablet Commonly known as: NORCO/VICODIN Stopped by: Abbie Sons, MD   ibuprofen 600 MG  tablet Commonly known as: ADVIL Stopped by: Abbie Sons, MD     TAKE these medications   fluticasone 50 MCG/ACT nasal spray Commonly known as: FLONASE Place 1 spray into both nostrils in the morning and at bedtime for 7 days.   lisinopril-hydrochlorothiazide 20-12.5 MG tablet Commonly known as: ZESTORETIC TAKE ONE (1) TABLET EACH DAY       Allergies: No Known Allergies  Family History: Family History  Problem Relation Age of Onset  . Kidney failure Mother        transplant  . Prostate cancer Maternal Grandfather   . Bladder Cancer Neg Hx     Social History:  reports that he quit smoking about 32 years ago. His smoking use included cigarettes. He has never used smokeless tobacco. He reports that he does not drink alcohol and does not use drugs.   Physical Exam: BP (!) 157/96   Pulse 82   Ht 5\' 6"  (1.676 m)   Wt 220 lb (99.8 kg)   BMI 35.51 kg/m   Constitutional:  Alert and oriented, No acute distress.   Assessment & Plan:    1. T1c intermediate risk prostate cancer  Pathology report was discussed in detail.  Significant increase volume Gleason 3+3 adenocarcinoma and 1 core Gleason 3+4 adenocarcinoma  Standard treatment modalities of radical prostatectomy and radiation (IMRT/brachytherapy) were again discussed  The pros and cons of each treatment were reviewed  We discussed that there is not considered a superior treatment for prostate cancer and that both are generally  considered equivalent however there are no head-to-head studies comparing radiation to surgery  We did discuss the high recurrence rate at 10-15 years with radiation as compared to surgery  He was interested in brachytherapy and would like to see radiation oncology  We also discussed the availability of HIFU  He will call if he desires to see Dr. Erlene Quan or Dr. Diamantina Providence to discuss radical prostatectomy  I spent 20 total minutes on the day of the encounter including pre-visit review of the  medical record, face-to-face time with the patient, and post visit ordering of labs/imaging/tests.  [facility] [address], [place], [place] [address] [phone]  I, Selena Batten, am acting as a scribe for Dr. Nicki Reaper C. Carrye Goller,  I have reviewed the above documentation for accuracy and completeness, and I agree with the above.   Abbie Sons, MD

## 2019-11-18 ENCOUNTER — Encounter: Payer: Self-pay | Admitting: Urology

## 2019-11-18 ENCOUNTER — Other Ambulatory Visit: Payer: Self-pay

## 2019-11-18 ENCOUNTER — Ambulatory Visit: Payer: BC Managed Care – PPO | Admitting: Urology

## 2019-11-18 ENCOUNTER — Telehealth: Payer: Self-pay | Admitting: Licensed Clinical Social Worker

## 2019-11-18 VITALS — BP 157/96 | HR 82 | Ht 66.0 in | Wt 220.0 lb

## 2019-11-18 DIAGNOSIS — C61 Malignant neoplasm of prostate: Secondary | ICD-10-CM

## 2019-11-18 NOTE — Telephone Encounter (Signed)
Called patient to offer consult appt for Thursday September 30 at 2 pm. Left voicemail to call and confirm

## 2019-11-24 ENCOUNTER — Ambulatory Visit
Admission: RE | Admit: 2019-11-24 | Discharge: 2019-11-24 | Disposition: A | Discharge status: Home / Self Care | Payer: BC Managed Care – PPO | Source: Ambulatory Visit | Attending: Radiation Oncology | Admitting: Radiation Oncology

## 2019-11-24 ENCOUNTER — Encounter: Payer: Self-pay | Admitting: Radiation Oncology

## 2019-11-24 ENCOUNTER — Encounter (INDEPENDENT_AMBULATORY_CARE_PROVIDER_SITE_OTHER): Payer: Self-pay

## 2019-11-24 ENCOUNTER — Other Ambulatory Visit: Payer: Self-pay

## 2019-11-24 VITALS — BP 146/88 | HR 83 | Temp 97.2°F | Resp 16 | Wt 219.5 lb

## 2019-11-24 DIAGNOSIS — K219 Gastro-esophageal reflux disease without esophagitis: Secondary | ICD-10-CM | POA: Diagnosis not present

## 2019-11-24 DIAGNOSIS — C61 Malignant neoplasm of prostate: Secondary | ICD-10-CM | POA: Diagnosis present

## 2019-11-24 DIAGNOSIS — Z923 Personal history of irradiation: Secondary | ICD-10-CM | POA: Diagnosis not present

## 2019-11-24 DIAGNOSIS — F1010 Alcohol abuse, uncomplicated: Secondary | ICD-10-CM | POA: Diagnosis not present

## 2019-11-24 DIAGNOSIS — Z87891 Personal history of nicotine dependence: Secondary | ICD-10-CM | POA: Insufficient documentation

## 2019-11-24 DIAGNOSIS — E785 Hyperlipidemia, unspecified: Secondary | ICD-10-CM | POA: Diagnosis not present

## 2019-11-24 DIAGNOSIS — I10 Essential (primary) hypertension: Secondary | ICD-10-CM | POA: Insufficient documentation

## 2019-11-24 NOTE — Consult Note (Signed)
NEW PATIENT EVALUATION  Name: Ray Saunders  MRN: [id number]  Date:   11/24/2019     DOB: May 24, 1960   This 59 y.o. 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  3.2  REFERRING PHYSICIAN: Sofie Hartigan, MD  CHIEF COMPLAINT:  Chief Complaint  Patient presents with  . Prostate Cancer    Initial consultation    DIAGNOSIS: The encounter diagnosis was Prostate cancer ([place]).   PREVIOUS INVESTIGATIONS:  Pathology report reviewed Clinical notes reviewed MRI scans reviewed  HPI: Patient is a healthy 59 year old male who is been under active surveillance since 2019.  He had an abnormal PSA velocity up to 2.5 had a 35 g prostate 1 of 12 cores on transrectal ultrasound-guided biopsy was positive for Gleason 6 adenocarcinoma.  He elected active surveillance.  MRI scan done in November 2020 showed a 1.6 mm low T2 lesion right posterior lateral mid peripheral zone possibly reflecting macroscopic prostate cancer PI-RADS 3.  There was no evidence of extracapsular extension seminal vesicle invasion or lymphadenopathy.  MRI prior to confirmatory biopsy back in July 2021 showed 2 of 4 cores positive for Gleason 6 adenocarcinoma another 4 biopsies were positive again for Gleason 6 and 1 of 12 cores was positive for Gleason 7 (3+4).  Patient is asymptomatic specifically denies any increased lower urinary tract symptoms such as frequency urgency no diarrhea or bone pain.  He has discussed treatment options with urology including robotic prostatectomy with Dr. Bernardo Heater and is now referred to radiation oncology for opinion.  PLANNED TREATMENT REGIMEN: Robotic prostatectomy versus I-125 interstitial implant  PAST MEDICAL HISTORY:  has a past medical history of Alcohol abuse, Elevated PSA, Elevated PSA, GERD (gastroesophageal reflux disease), History of kidney stones, HLD (hyperlipidemia), and  Hypertension.    PAST SURGICAL HISTORY:  Past Surgical History:  Procedure Laterality Date  . COLONOSCOPY WITH PROPOFOL N/A 10/09/2014   Procedure: COLONOSCOPY WITH PROPOFOL;  Surgeon: Lollie Sails, MD;  Location: [facility] ENDOSCOPY;  Service: Endoscopy;  Laterality: N/A;    FAMILY HISTORY: family history includes Kidney failure in his mother; Prostate cancer in his maternal grandfather.  SOCIAL HISTORY:  reports that he quit smoking about 32 years ago. His smoking use included cigarettes. He has never used smokeless tobacco. He reports that he does not drink alcohol and does not use drugs.  ALLERGIES: Patient has no known allergies.  MEDICATIONS:  Current Outpatient Medications  Medication Sig Dispense Refill  . lisinopril-hydrochlorothiazide (PRINZIDE,ZESTORETIC) 20-12.5 MG tablet TAKE ONE (1) TABLET EACH DAY  2   No current facility-administered medications for this encounter.    ECOG PERFORMANCE STATUS:  0 - Asymptomatic  REVIEW OF SYSTEMS: Patient denies any weight loss, fatigue, weakness, fever, chills or night sweats. Patient denies any loss of vision, blurred vision. Patient denies any ringing  of the ears or hearing loss. No irregular heartbeat. Patient denies heart murmur or history of fainting. Patient denies any chest pain or pain radiating to her upper extremities. Patient denies any shortness of breath, difficulty breathing at night, cough or hemoptysis. Patient denies any swelling in the lower legs. Patient denies any nausea vomiting, vomiting of blood, or coffee ground material in the vomitus. Patient denies any stomach pain. Patient states has had normal bowel movements no significant constipation or diarrhea. Patient denies any dysuria, hematuria or significant nocturia. Patient denies any problems walking, swelling in the joints or  loss of balance. Patient denies any skin changes, loss of hair or loss of weight. Patient denies any excessive worrying or anxiety or  significant depression. Patient denies any problems with insomnia. Patient denies excessive thirst, polyuria, polydipsia. Patient denies any swollen glands, patient denies easy bruising or easy bleeding. Patient denies any recent infections, allergies or URI. Patient "s visual fields have not changed significantly in recent time.   PHYSICAL EXAM: BP (!) 146/88 (BP Location: Left Arm)   Pulse 83   Temp (!) 97.2 F (36.2 C) (Tympanic)   Resp 16   Wt 219 lb 8 oz (99.6 kg)   BMI 35.43 kg/m  Well-developed well-nourished patient in NAD. HEENT reveals PERLA, EOMI, discs not visualized.  Oral cavity is clear. No oral mucosal lesions are identified. Neck is clear without evidence of cervical or supraclavicular adenopathy. Lungs are clear to A&P. Cardiac examination is essentially unremarkable with regular rate and rhythm without murmur rub or thrill. Abdomen is benign with no organomegaly or masses noted. Motor sensory and DTR levels are equal and symmetric in the upper and lower extremities. Cranial nerves II through XII are grossly intact. Proprioception is intact. No peripheral adenopathy or edema is identified. No motor or sensory levels are noted. Crude visual fields are within normal range.  LABORATORY DATA: Pathology report reviewed    RADIOLOGY RESULTS: MRI scans reviewed   IMPRESSION: Intermittent stage adenocarcinoma the prostate in 59 year old male for discussion of robotic prostatectomy versus I-125 interstitial implant.  PLAN: At this time of gone over the rationale and risks and benefits of I-125 interstitial implant which I would prefer in a 59 year old excellent health male.  Risks and benefits of treatment including radiation safety precautions for the 2 months of active seeds.  Side effects including increased lower urinary tract symptoms such as frequency urgency and nocturia fatigue possible diarrhea all were discussed in detail with the patient.  We also discussed the possibility of  erectile dysfunction and incontinence.  Patient is interested in talking to the urologist who performed robotic prostatectomy's which I have recommended and have set him up to either see Dr. Annetta Maw or Dr. Erlene Quan over the next couple of weeks.  I set up a 3-week follow-up to discuss his preferences at that time.  Call into urology was made for appointment.  Patient knows to call with any concerns.  I would like to take this opportunity to thank you for allowing me to participate in the care of your patient.Noreene Filbert, MD

## 2019-11-29 NOTE — Progress Notes (Signed)
11/30/2019 3:57 PM   Ray Saunders [birth year] [id number]  Referring provider: Sofie Hartigan, MD [place],  [place] [address] Chief Complaint  Patient presents with  . Advice Only    HPI: Ray Saunders is a 59 y.o. male who returns today for discussion regarding radical prostatectomy.   Patient has a history of clinical T1c very low risk prostate cancer. He underwent a biopsy in 08/2017 for abnormal PSA velocity. Prostate volume 35 g. Past 1/12 cores positive Gleason 3+3 adenocarcinoma (left lateral mid, 4%).Patient clected active surveillance  MRI prior to confirmatory biopsy 46 g prostate; PI-RADS 3 lesion R PZ. MR fusion biopsy 08/27/2019 2/4 ROI biopsies positive Gleason 3+3 adenocarcinoma; 4/12 biopsies positive for Gleason 3+3 adenocarcinoma with tissue involvement ranging 10-90%; 1/12 core positive Gleason 3+4 adenocarcinoma (30%)  PSA was 3.2 as of 07/29/2019.   The patient last saw Dr. Bernardo Heater on 11/18/2019 for management of T1c intermediate risk prostate cancer. At that time he had no complaints.   He denies any erectile dysfunction. He notes a very slight change in libido but this has no effect on his sex life.   Former smoker.  Has also recently been seen by Dr. Donella Stade for evaluation of XRT as treatment option.  PSA trend: Component     Latest Ref Rng & Units 08/12/2016 01/30/2017 08/07/2017 01/12/2018  Prostate Specific Ag, Serum     0.0 - 4.0 ng/mL 2.6 2.3 2.5 2.7   Component     Latest Ref Rng & Units 05/14/2018 07/29/2019  Prostate Specific Ag, Serum     0.0 - 4.0 ng/mL 3.4 3.2    PMH: Past Medical History:  Diagnosis Date  . Alcohol abuse   . Elevated PSA   . Elevated PSA   . GERD (gastroesophageal reflux disease)   . History of kidney stones   . HLD (hyperlipidemia)   . Hypertension     Surgical History: Past Surgical History:  Procedure Laterality Date  . COLONOSCOPY WITH PROPOFOL N/A 10/09/2014   Procedure: COLONOSCOPY WITH  PROPOFOL;  Surgeon: Lollie Sails, MD;  Location: [facility] ENDOSCOPY;  Service: Endoscopy;  Laterality: N/A;    Home Medications:  Allergies as of 11/30/2019   No Known Allergies     Medication List       Accurate as of November 30, 2019  3:57 PM. If you have any questions, ask your nurse or doctor.        lisinopril-hydrochlorothiazide 20-12.5 MG tablet Commonly known as: ZESTORETIC TAKE ONE (1) TABLET EACH DAY       Allergies: No Known Allergies  Family History: Family History  Problem Relation Age of Onset  . Kidney failure Mother        transplant  . Prostate cancer Maternal Grandfather   . Bladder Cancer Neg Hx     Social History:  reports that he quit smoking about 32 years ago. His smoking use included cigarettes. He has never used smokeless tobacco. He reports that he does not drink alcohol and does not use drugs.   Physical Exam: BP (!) 166/87   Pulse 80   Ht 5\' 6"  (1.676 m)   Wt 224 lb 6.4 oz (101.8 kg)   BMI 36.22 kg/m   Constitutional:  Alert and oriented, No acute distress. HEENT: [place] AT, moist mucus membranes.  Trachea midline, no masses. Cardiovascular: No clubbing, cyanosis, or edema. Respiratory: Normal respiratory effort, no increased work of breathing. Skin: No rashes, bruises or suspicious lesions.  Neurologic: Grossly intact, no focal deficits, moving all 4 extremities. Psychiatric: Normal mood and affect.  Laboratory Data:  Lab Results  Component Value Date   CREATININE 1.37 (H) 07/07/2019     Assessment & Plan:    1. T1c favorable intermediate risk prostate cancer Patient has favorable intermediate risk prostate cancer (low volume) and we discussed continuing active surveillance and consideration of undergo cancer genetic screening to help further risk stratify in addition to options including prostatectomy and XRT today.   The patient was counseled about the natural history of prostate cancer and the standard treatment options that  are available for prostate cancer. It was explained to him how his age and life expectancy, clinical stage, Gleason score, and PSA affect his prognosis, the decision to proceed with additional staging studies, as well as how that information influences recommended treatment strategies. We discussed the roles for active surveillance, radiation therapy, surgical therapy, androgen deprivation, as well as ablative therapy options for the treatment of prostate cancer as appropriate to his individual cancer situation. We discussed the risks and benefits of these options with regard to their impact on cancer control and also in terms of potential adverse events, complications, and impact on quality of life particularly related to urinary, bowel, and sexual function. The patient was encouraged to ask questions throughout the discussion today and all questions were answered to his stated satisfaction. In addition, the patient was provided with and/or directed to appropriate resources and literature for further education about prostate cancer treatment options.  We discussed surgical therapy for prostate cancer including the different available surgical approaches.  Specifically, we discussed robotic prostatectomy with pelvic lymph node dissection based on his restratification.  We discussed, in detail, the risks and expectations of surgery with regard to cancer control, urinary control, and erectile dysfunction as well as expected post operative recovery processed. Additional risks of surgery including but not limited to bleeding, infection, hernia formation, nerve damage, fistula formation, bowel/rectal injury, potentially necessitating colostomy, damage to the urinary tract resulting in urinary leakage, urethral stricture, and cardiopulmonary risk such as myocardial infarction, stroke, death, thromboembolism etc. were explained.   Patient has agreed to genetic testing on malignancy to help guide decision making  (ocnotype dx)  [facility] [address], [place], [place] [address] [phone]  I, Selena Batten, am acting as a scribe for Dr. Hollice Espy.  I have reviewed the above documentation for accuracy and completeness, and I agree with the above.   Hollice Espy, MD  I spent 40 min with this patient of which greater than 50% was spent in counseling and coordination of care with the patient.

## 2019-11-30 ENCOUNTER — Other Ambulatory Visit: Payer: Self-pay

## 2019-11-30 ENCOUNTER — Ambulatory Visit (INDEPENDENT_AMBULATORY_CARE_PROVIDER_SITE_OTHER): Payer: BC Managed Care – PPO | Admitting: Urology

## 2019-11-30 ENCOUNTER — Encounter: Payer: Self-pay | Admitting: Urology

## 2019-11-30 VITALS — BP 166/87 | HR 80 | Ht 66.0 in | Wt 224.4 lb

## 2019-11-30 DIAGNOSIS — C61 Malignant neoplasm of prostate: Secondary | ICD-10-CM | POA: Diagnosis not present

## 2019-12-08 ENCOUNTER — Telehealth: Payer: Self-pay

## 2019-12-08 NOTE — Telephone Encounter (Signed)
-----   Message from Hollice Espy, MD sent at 12/08/2019  8:38 AM EDT ----- This is the genetic testing for prostate cancer guy I was talking about.  Can you help facilitate this?    [facility]  Hollice Espy, MD

## 2019-12-08 NOTE — Telephone Encounter (Signed)
Order was placed for Oncotype test through online portal

## 2019-12-19 ENCOUNTER — Ambulatory Visit: Payer: BC Managed Care – PPO | Admitting: Radiation Oncology

## 2019-12-29 ENCOUNTER — Other Ambulatory Visit: Payer: Self-pay | Admitting: Urology

## 2019-12-29 NOTE — Telephone Encounter (Signed)
Results received and scanned in chart

## 2020-01-18 ENCOUNTER — Ambulatory Visit (INDEPENDENT_AMBULATORY_CARE_PROVIDER_SITE_OTHER): Payer: BC Managed Care – PPO | Admitting: Urology

## 2020-01-18 ENCOUNTER — Other Ambulatory Visit: Payer: Self-pay

## 2020-01-18 VITALS — BP 141/87 | HR 71

## 2020-01-18 DIAGNOSIS — C61 Malignant neoplasm of prostate: Secondary | ICD-10-CM

## 2020-01-18 NOTE — Progress Notes (Signed)
01/18/2020 12:41 PM   Ray Saunders 07/11/1960 [id number]  Referring provider: Sofie Hartigan, MD [place],  [place] [address]   HPI: 59 year old male with favorable intermediate risk prostate cancer, low-volume returns today with cancer genetics to further discuss treatment options.  Patient has a history of clinical T1c very low risk prostate cancer. He underwent a biopsy in 08/2017 for abnormal PSA velocity. Prostate volume 35 g. Past 1/12 cores positive Gleason 3+3 adenocarcinoma (left lateral mid, 4%).Patient clected active surveillance  MRI prior to confirmatory biopsy 46 g prostate; PI-RADS 3 lesion R PZ. MR fusion biopsy 7/3/20212/4 ROI biopsies positive Gleason 3+3 adenocarcinoma; 4/12 biopsies positive for Gleason 3+3 adenocarcinoma with tissue involvement ranging 10-90%; 1/12 core positive Gleason 3+4 adenocarcinoma (30%)  In the interim, he is had consultation with myself regarding surgical intervention as well as Dr. Baruch Gouty primarily around the option of brachytherapy.  At last visit, we discussed the option of cancer genetic profile to help with restratification.  He underwent Oncotype DX genetic profile and ultimately achieved a GPS score of 41, higher than the baseline 28 in his same risk category.  With treatment, he has a 1% chance of cancer death, 6% chance of metastatic recurrence and a 50% chance of having adverse pathology including extracapsular extension or Gleason 4+3 disease.  He is at higher risk for disease progression than his peers in the same category.  Today, he is accompanied by his wife.  In terms of baseline urinary symptoms, he is no significant symptoms.  No previous abdominal surgeries.  Good baseline erections.  PMH: Past Medical History:  Diagnosis Date  . Alcohol abuse   . Elevated PSA   . Elevated PSA   . GERD (gastroesophageal reflux disease)   . History of kidney stones   . HLD (hyperlipidemia)   . Hypertension      Surgical History: Past Surgical History:  Procedure Laterality Date  . COLONOSCOPY WITH PROPOFOL N/A 10/09/2014   Procedure: COLONOSCOPY WITH PROPOFOL;  Surgeon: Lollie Sails, MD;  Location: [facility] ENDOSCOPY;  Service: Endoscopy;  Laterality: N/A;    Home Medications:  Allergies as of 01/18/2020   No Known Allergies     Medication List       Accurate as of January 18, 2020 12:41 PM. If you have any questions, ask your nurse or doctor.        lisinopril-hydrochlorothiazide 20-12.5 MG tablet Commonly known as: ZESTORETIC TAKE ONE (1) TABLET EACH DAY       Allergies: No Known Allergies  Family History: Family History  Problem Relation Age of Onset  . Kidney failure Mother        transplant  . Prostate cancer Maternal Grandfather   . Bladder Cancer Neg Hx     Social History:  reports that he quit smoking about 32 years ago. His smoking use included cigarettes. He has never used smokeless tobacco. He reports that he does not drink alcohol and does not use drugs.   Physical Exam: BP (!) 141/87   Pulse 71   Constitutional:  Alert and oriented, No acute distress. HEENT: [place] AT, moist mucus membranes.  Trachea midline, no masses. Cardiovascular: No clubbing, cyanosis, or edema. Respiratory: Normal respiratory effort, no increased work of breathing. Skin: No rashes, bruises or suspicious lesions. Neurologic: Grossly intact, no focal deficits, moving all 4 extremities. Psychiatric: Normal mood and affect.  Laboratory Data: Lab Results  Component Value Date   WBC 8.7 07/07/2019   HGB  16.7 07/07/2019   HCT 48.5 07/07/2019   MCV 87.5 07/07/2019   PLT 248 07/07/2019    Lab Results  Component Value Date   CREATININE 1.37 (H) 07/07/2019     Assessment & Plan:    1. Prostate cancer ([place]) Favorable intermediate risk prostate cancer relatively young fairly healthy male  Cancer genetics via Oncotype DX restratification results reviewed today in detail.   Please see scanned media for these results.  In summary, he is a higher risk of adverse pathology as well as disease progression than his peers in the same risk category.  This favors treatment.  We again reviewed prostatectomy with bilateral pelvic lymph node dissection versus radiation either in the form of brachytherapy versus EBRT with hormones for 6 months.  Again lengthy discussion about the risk and benefits of each.  Please refer to my previous notes for this discussion.   He had lots of questions today about the postoperative course as well as recovery as relates to return of continence and erections which are answered very honestly today.  His wife is present today and is able to answer her question specifically.  He is leaning toward surgery.  He will let us know what his ultimate decision is.  If he elects to have surgery, we will plan for preoperative physical therapy evaluation.  If not, will also help arrange for brachytherapy with ADT.  All questions answered.   Hollice Espy, MD  [facility] [address] [place], [place] [address] [phone]  I spent 30 total minutes on the day of the encounter including pre-visit review of the medical record, face-to-face time with the patient, and post visit ordering of labs/imaging/tests.

## 2020-02-13 ENCOUNTER — Telehealth: Payer: Self-pay | Admitting: *Deleted

## 2020-02-13 NOTE — Telephone Encounter (Signed)
Called patient to relay Dr/ Chrystal's opinion that he would probably not need hormone injections after seed implants.

## 2020-02-14 ENCOUNTER — Telehealth: Payer: Self-pay | Admitting: Radiology

## 2020-02-14 NOTE — Telephone Encounter (Signed)
LMOM to return call. Need to discuss patient's decision regarding robotic prostatectomy.

## 2020-02-29 ENCOUNTER — Other Ambulatory Visit: Payer: Self-pay | Admitting: Radiology

## 2020-02-29 ENCOUNTER — Encounter: Payer: Self-pay | Admitting: Radiation Oncology

## 2020-02-29 ENCOUNTER — Ambulatory Visit
Admission: RE | Admit: 2020-02-29 | Discharge: 2020-02-29 | Disposition: A | Discharge status: Home / Self Care | Payer: BC Managed Care – PPO | Source: Ambulatory Visit | Attending: Radiation Oncology | Admitting: Radiation Oncology

## 2020-02-29 VITALS — BP 140/80 | HR 68 | Temp 97.5°F | Resp 16 | Wt 221.0 lb

## 2020-02-29 DIAGNOSIS — C61 Malignant neoplasm of prostate: Secondary | ICD-10-CM

## 2020-02-29 NOTE — Progress Notes (Signed)
Radiation Oncology Follow up Note  Name: Ray Saunders   Date:   02/29/2020 MRN:  [id number] DOB: [birth year]    This 60 y.o. male presents to the clinic today for further discussion of I-125 interstitial implant versus robotic prostatectomy in patient with mostly Gleason 6 (3+3) who had elected active surveillance.  REFERRING PROVIDER: Marina Goodell, MD  HPI: Patient is a 60 year old male originally consulted back in September when he presented with mostly Gleason 6 in 1 of 12 cores biopsied and patient had elected active surveillance.  He underwent an MRI scan showing a 46 g prostate with a PI-RADS 3 lesion in the right peripheral zone.  Biopsy was positive for Gleason 6 adenocarcinoma.  4 of 12 biopsies were also positive for Gleason 6.  There was 1 of 12 cores positive for Gleason 7 (3+4)..  Patient has seen Dr. Apolinar Junes underwent Oncotype DX genetic profile with a GPS score of 41 showing high risk of disease progression then peers in the same category.  He is seen today by himself for further discussion.  He has been recommended for robotic prostatectomy by Dr. Apolinar Junes.  He continues to do well with very low urinary symptoms.  COMPLICATIONS OF TREATMENT: none  FOLLOW UP COMPLIANCE: keeps appointments   PHYSICAL EXAM:  BP 140/80 (BP Location: Left Arm, Patient Position: Sitting, Cuff Size: Normal)   Pulse 68   Temp (!) 97.5 F (36.4 C) (Tympanic)   Resp 16   Wt 221 lb (100.2 kg)   BMI 35.67 kg/m  Well-developed well-nourished patient in NAD. HEENT reveals PERLA, EOMI, discs not visualized.  Oral cavity is clear. No oral mucosal lesions are identified. Neck is clear without evidence of cervical or supraclavicular adenopathy. Lungs are clear to A&P. Cardiac examination is essentially unremarkable with regular rate and rhythm without murmur rub or thrill. Abdomen is benign with no organomegaly or masses noted. Motor sensory and DTR levels are equal and symmetric in the upper and lower  extremities. Cranial nerves II through XII are grossly intact. Proprioception is intact. No peripheral adenopathy or edema is identified. No motor or sensory levels are noted. Crude visual fields are within normal range.  RADIOLOGY RESULTS: MRI reviewed and compatible with above-stated findings  PLAN: At this time I can have an extensive discussion with the patient regarding robotic prostatectomy versus I-125 interstitial implant.  Patient knows the problem with salvage after radiation therapy versus ability to salvage with radiation should he undergo a prostatectomy.  Risks and benefits of treatment including increased lower urinary tract symptoms possible diarrhea fatigue risks of anesthesia all were reviewed again about I-125 interstitial implant.  Patient is still leaning towards implant.  I will go ahead and start planning for his volume study he has several weeks to cancel that should he decide to go for prostatectomy.  I would like to take this opportunity to thank you for allowing me to participate in the care of your patient.Carmina Miller, MD

## 2020-03-08 ENCOUNTER — Other Ambulatory Visit: Payer: BC Managed Care – PPO

## 2020-03-12 ENCOUNTER — Other Ambulatory Visit: Admission: RE | Admit: 2020-03-12 | Payer: BC Managed Care – PPO | Source: Ambulatory Visit

## 2020-03-12 NOTE — Progress Notes (Signed)
No show for covid test. Call to patient, stated that due to the weather he could not make it today and that he had already spoken to the urology office to let them know that he was going to cancel the procedure on Wednesday.

## 2020-03-14 ENCOUNTER — Ambulatory Visit: Payer: BC Managed Care – PPO | Admitting: Radiation Oncology

## 2020-03-14 ENCOUNTER — Ambulatory Visit: Payer: BC Managed Care – PPO

## 2020-03-21 ENCOUNTER — Telehealth: Payer: Self-pay | Admitting: Radiology

## 2020-03-21 NOTE — Telephone Encounter (Signed)
LMOM for patient to return call. Discussed patient's wishes with wife regarding brachytherapy versus prostatectomy for prostate cancer. Patient cancelled scheduled brachytherapy. Wife states patient has not made a decision at this time. Explained that postponing treatment increases risk of metastasis. Wife states she will notify patient of call.

## 2020-03-27 ENCOUNTER — Other Ambulatory Visit: Payer: BC Managed Care – PPO

## 2020-03-30 ENCOUNTER — Other Ambulatory Visit: Payer: BC Managed Care – PPO

## 2020-04-02 ENCOUNTER — Other Ambulatory Visit: Payer: BC Managed Care – PPO

## 2020-04-03 ENCOUNTER — Other Ambulatory Visit: Payer: BC Managed Care – PPO

## 2020-04-04 ENCOUNTER — Ambulatory Visit: Payer: BC Managed Care – PPO | Admitting: Radiation Oncology

## 2020-04-04 ENCOUNTER — Ambulatory Visit: Admission: RE | Admit: 2020-04-04 | Payer: BC Managed Care – PPO | Source: Home / Self Care

## 2020-04-04 ENCOUNTER — Encounter: Admission: RE | Payer: Self-pay | Source: Home / Self Care

## 2020-04-04 SURGERY — ULTRASOUND, PROSTATE, FOR VOLUME DETERMINATION
Anesthesia: Choice

## 2020-04-05 ENCOUNTER — Other Ambulatory Visit: Payer: BC Managed Care – PPO

## 2020-04-07 ENCOUNTER — Other Ambulatory Visit: Payer: Self-pay

## 2020-04-07 ENCOUNTER — Emergency Department
Admission: EM | Admit: 2020-04-07 | Discharge: 2020-04-07 | Disposition: A | Discharge status: Home / Self Care | Payer: BC Managed Care – PPO | Attending: Emergency Medicine | Admitting: Emergency Medicine

## 2020-04-07 DIAGNOSIS — I10 Essential (primary) hypertension: Secondary | ICD-10-CM | POA: Insufficient documentation

## 2020-04-07 DIAGNOSIS — Z87891 Personal history of nicotine dependence: Secondary | ICD-10-CM | POA: Insufficient documentation

## 2020-04-07 DIAGNOSIS — Z8546 Personal history of malignant neoplasm of prostate: Secondary | ICD-10-CM | POA: Diagnosis not present

## 2020-04-07 DIAGNOSIS — W293XXA Contact with powered garden and outdoor hand tools and machinery, initial encounter: Secondary | ICD-10-CM | POA: Diagnosis not present

## 2020-04-07 DIAGNOSIS — S81012A Laceration without foreign body, left knee, initial encounter: Secondary | ICD-10-CM | POA: Insufficient documentation

## 2020-04-07 DIAGNOSIS — S8992XA Unspecified injury of left lower leg, initial encounter: Secondary | ICD-10-CM | POA: Diagnosis present

## 2020-04-07 DIAGNOSIS — Z79899 Other long term (current) drug therapy: Secondary | ICD-10-CM | POA: Insufficient documentation

## 2020-04-07 MED ORDER — LIDOCAINE HCL (PF) 1 % IJ SOLN
5.0000 mL | Freq: Once | INTRAMUSCULAR | Status: AC
  Administered 2020-04-07: 5 mL
  Filled 2020-04-07: qty 5

## 2020-04-07 MED ORDER — NAPROXEN 500 MG PO TABS: 500.0000 mg | ORAL_TABLET | Freq: Two times a day (BID) | ORAL | Status: DC

## 2020-04-07 MED ORDER — SULFAMETHOXAZOLE-TRIMETHOPRIM 800-160 MG PO TABS
1.0000 | ORAL_TABLET | Freq: Two times a day (BID) | ORAL | 0 refills | Status: DC
Start: 2020-04-07 — End: 2020-08-28

## 2020-04-07 MED ORDER — BACITRACIN-NEOMYCIN-POLYMYXIN 400-5-5000 EX OINT
TOPICAL_OINTMENT | Freq: Once | CUTANEOUS | Status: AC
  Administered 2020-04-07: 1 via TOPICAL
  Filled 2020-04-07: qty 1

## 2020-04-07 MED ORDER — OXYCODONE-ACETAMINOPHEN 7.5-325 MG PO TABS: 1.0000 | ORAL_TABLET | Freq: Four times a day (QID) | ORAL | 0 refills | Status: DC | PRN

## 2020-04-07 NOTE — ED Triage Notes (Signed)
Pt to ED POv for chief complaint of laceration to left knee from chainsaw.  Wound wrapped from daughter in law who is a nurse, bleeding controlled at this time.  Ambulatory to triage with limp Skin color WDL

## 2020-04-07 NOTE — Discharge Instructions (Addendum)
Follow discharge care instruction take medication as directed.  Have sutures removed in 10 days.  Be advised pain medication may cause drowsiness.

## 2020-04-07 NOTE — ED Provider Notes (Signed)
[facility] Emergency Department Provider Note   ____________________________________________   Event Date/Time   First MD Initiated Contact with Patient 04/07/20 1407     (approximate)  I have reviewed the triage vital signs and the nursing notes.   HISTORY  Chief Complaint Laceration    HPI Ray Saunders is a 60 y.o. male patient presents with left anterior knee laceration secondary to a chainsaw.  Patient denies loss sensation loss of function.  Bleeding is controlled with direct pressure.  Patient tetanus shot is up-to-date.  Rates pain is a 5/10.  Describes pain as "sore".         Past Medical History:  Diagnosis Date  . Alcohol abuse   . Elevated PSA   . Elevated PSA   . GERD (gastroesophageal reflux disease)   . History of kidney stones   . HLD (hyperlipidemia)   . Hypertension     Patient Active Problem List   Diagnosis Date Noted  . Prostate cancer ([place]) 07/07/2018  . Chest pain at rest 07/07/2017  . Dyspnea on exertion 07/07/2017  . Rising PSA level 10/17/2014  . BP (high blood pressure) 08/24/2014  . Abnormal prostate specific antigen 09/05/2013  . HLD (hyperlipidemia) 09/05/2013    Past Surgical History:  Procedure Laterality Date  . COLONOSCOPY WITH PROPOFOL N/A 10/09/2014   Procedure: COLONOSCOPY WITH PROPOFOL;  Surgeon: Lollie Sails, MD;  Location: [facility] ENDOSCOPY;  Service: Endoscopy;  Laterality: N/A;    Prior to Admission medications   Medication Sig Start Date End Date Taking? Authorizing Provider  naproxen (NAPROSYN) 500 MG tablet Take 1 tablet (500 mg total) by mouth 2 (two) times daily with a meal. 04/07/20  Yes Sable Feil, PA-C  oxyCODONE-acetaminophen (PERCOCET) 7.5-325 MG tablet Take 1 tablet by mouth every 6 (six) hours as needed for severe pain. 04/07/20  Yes Sable Feil, PA-C  sulfamethoxazole-trimethoprim (BACTRIM DS) 800-160 MG tablet Take 1 tablet by mouth 2 (two) times daily. 04/07/20  Yes  Sable Feil, PA-C  lisinopril-hydrochlorothiazide (PRINZIDE,ZESTORETIC) 20-12.5 MG tablet TAKE ONE (1) TABLET EACH DAY 12/23/17   [provider]    Allergies Patient has no known allergies.  Family History  Problem Relation Age of Onset  . Kidney failure Mother        transplant  . Prostate cancer Maternal Grandfather   . Bladder Cancer Neg Hx     Social History Social History   Tobacco Use  . Smoking status: Former Smoker    Types: Cigarettes    Quit date: 08/24/1987    Years since quitting: 32.6  . Smokeless tobacco: Never Used  Substance Use Topics  . Alcohol use: No    Alcohol/week: 0.0 standard drinks  . Drug use: No    Review of Systems Constitutional: No fever/chills Eyes: No visual changes. ENT: No sore throat. Cardiovascular: Denies chest pain. Respiratory: Denies shortness of breath. Gastrointestinal: No abdominal pain.  No nausea, no vomiting.  No diarrhea.  No constipation. Genitourinary: Negative for dysuria. Musculoskeletal: Negative for back pain. Skin: Negative for rash.  Laceration left anterior knee Neurological: Negative for headaches, focal weakness or numbness. Endocrine:  Hyperlipidemia and hypertension   ____________________________________________   PHYSICAL EXAM:  VITAL SIGNS: ED Triage Vitals  Enc Vitals Group     BP 04/07/20 1402 (!) 152/108     Pulse Rate 04/07/20 1402 (!) 108     Resp 04/07/20 1402 16     Temp 04/07/20 1402 98.7 F (37.1  C)     Temp Source 04/07/20 1402 Oral     SpO2 04/07/20 1402 97 %     Weight 04/07/20 1403 220 lb (99.8 kg)     Height 04/07/20 1403 5\' 6"  (1.676 m)     Head Circumference --      Peak Flow --      Pain Score 04/07/20 1402 5     Pain Loc --      Pain Edu? --      Excl. in [place]? --    Constitutional: Alert and oriented. Well appearing and in no acute distress. Cardiovascular: Normal rate, regular rhythm. Grossly normal heart sounds.  Good peripheral circulation.  Elevated  blood pressure. Respiratory: Normal respiratory effort.  No retractions. Lungs CTAB. Musculoskeletal: No lower extremity tenderness nor edema.  No joint effusions. Neurologic:  Normal speech and language. No gross focal neurologic deficits are appreciated. No gait instability. Skin: 5 cm laceration left anterior knee.  Psychiatric: Mood and affect are normal. Speech and behavior are normal.  ____________________________________________   LABS (all labs ordered are listed, but only abnormal results are displayed)  Labs Reviewed - No data to display ____________________________________________  EKG   ____________________________________________  RADIOLOGY I, Sable Feil, personally viewed and evaluated these images (plain radiographs) as part of my medical decision making, as well as reviewing the written report by the radiologist.  ED MD interpretation:    Official radiology report(s): No results found.  ____________________________________________   PROCEDURES  Procedure(s) performed (including Critical Care):  Marland KitchenMarland KitchenLaceration Repair  Date/Time: 04/07/2020 2:55 PM Performed by: Sable Feil, PA-C Authorized by: Sable Feil, PA-C   Consent:    Consent obtained:  Verbal   Consent given by:  Patient   Risks discussed:  Infection, pain, retained foreign body, poor cosmetic result and need for additional repair Universal protocol:    Procedure explained and questions answered to patient or proxy's satisfaction: yes     Relevant documents present and verified: yes     Test results available: no     Imaging studies available: no     Required blood products, implants, devices, and special equipment available: no     Site/side marked: no     Immediately prior to procedure, a time out was called: yes     Patient identity confirmed:  Verbally with patient Anesthesia:    Anesthesia method:  Local infiltration   Local anesthetic:  Lidocaine 1% w/o epi Laceration  details:    Location:  Leg   Leg location:  L knee   Length (cm):  5   Depth (mm):  3 Pre-procedure details:    Preparation:  Patient was prepped and draped in usual sterile fashion Exploration:    Limited defect created (wound extended): no     Hemostasis achieved with:  Direct pressure   Wound exploration: wound explored through full range of motion and entire depth of wound visualized     Contaminated: yes   Treatment:    Area cleansed with:  Povidone-iodine and saline   Amount of cleaning:  Standard   Irrigation solution:  Sterile saline   Irrigation method:  Syringe   Debridement:  None   Undermining:  None Skin repair:    Repair method:  Sutures   Suture size:  3-0   Suture material:  Nylon   Suture technique:  Simple interrupted   Number of sutures:  10 Approximation:    Approximation:  Close Repair type:  Repair type:  Simple Post-procedure details:    Dressing:  Antibiotic ointment, non-adherent dressing and sterile dressing   Procedure completion:  Tolerated well, no immediate complications     ____________________________________________   INITIAL IMPRESSION / ASSESSMENT AND PLAN / ED COURSE  As part of my medical decision making, I reviewed the following data within the electronic MEDICAL RECORD NUMBER         Patient presents with laceration to the left anterior knee.  See wound closure incision no.  Patient given discharge care instruction advised to have sutures removed in 10 days by his PCP or return back to ED.  Take medications as directed.      ____________________________________________   FINAL CLINICAL IMPRESSION(S) / ED DIAGNOSES  Final diagnoses:  Knee laceration, left, initial encounter     ED Discharge Orders         Ordered    sulfamethoxazole-trimethoprim (BACTRIM DS) 800-160 MG tablet  2 times daily        04/07/20 1452    naproxen (NAPROSYN) 500 MG tablet  2 times daily with meals        04/07/20 1452     oxyCODONE-acetaminophen (PERCOCET) 7.5-325 MG tablet  Every 6 hours PRN        04/07/20 1452          *Please note:  Ray Saunders was evaluated in Emergency Department on 04/07/2020 for the symptoms described in the history of present illness. He was evaluated in the context of the global COVID-19 pandemic, which necessitated consideration that the patient might be at risk for infection with the SARS-CoV-2 virus that causes COVID-19. Institutional protocols and algorithms that pertain to the evaluation of patients at risk for COVID-19 are in a state of rapid change based on information released by regulatory bodies including the CDC and federal and state organizations. These policies and algorithms were followed during the patient's care in the ED.  Some ED evaluations and interventions may be delayed as a result of limited staffing during and the pandemic.*   Note:  This document was prepared using Dragon voice recognition software and may include unintentional dictation errors.    Sable Feil, PA-C 04/07/20 1500    Vladimir Crofts, MD 04/07/20 1600

## 2020-04-24 ENCOUNTER — Other Ambulatory Visit: Payer: BC Managed Care – PPO

## 2020-04-26 ENCOUNTER — Other Ambulatory Visit: Payer: BC Managed Care – PPO

## 2020-04-30 ENCOUNTER — Encounter: Admission: RE | Payer: Self-pay | Source: Home / Self Care

## 2020-04-30 ENCOUNTER — Ambulatory Visit: Admission: RE | Admit: 2020-04-30 | Payer: BC Managed Care – PPO | Source: Home / Self Care | Admitting: Urology

## 2020-04-30 SURGERY — INSERTION, RADIATION SOURCE, PROSTATE
Anesthesia: General

## 2020-05-04 ENCOUNTER — Other Ambulatory Visit: Payer: Self-pay | Admitting: Radiology

## 2020-05-04 DIAGNOSIS — C61 Malignant neoplasm of prostate: Secondary | ICD-10-CM

## 2020-05-07 ENCOUNTER — Ambulatory Visit: Payer: BC Managed Care – PPO

## 2020-05-07 ENCOUNTER — Ambulatory Visit: Payer: BC Managed Care – PPO | Admitting: Radiation Oncology

## 2020-05-25 HISTORY — PX: OTHER SURGICAL HISTORY: SHX169

## 2020-05-28 ENCOUNTER — Ambulatory Visit: Payer: BC Managed Care – PPO | Admitting: Radiation Oncology

## 2020-05-28 ENCOUNTER — Ambulatory Visit: Payer: BC Managed Care – PPO

## 2020-06-04 ENCOUNTER — Other Ambulatory Visit
Admission: RE | Admit: 2020-06-04 | Discharge: 2020-06-04 | Disposition: A | Discharge status: Home / Self Care | Payer: BC Managed Care – PPO | Source: Ambulatory Visit | Attending: Radiation Oncology | Admitting: Radiation Oncology

## 2020-06-04 ENCOUNTER — Other Ambulatory Visit: Payer: Self-pay

## 2020-06-04 DIAGNOSIS — Z01812 Encounter for preprocedural laboratory examination: Secondary | ICD-10-CM | POA: Diagnosis present

## 2020-06-04 DIAGNOSIS — Z20822 Contact with and (suspected) exposure to covid-19: Secondary | ICD-10-CM | POA: Insufficient documentation

## 2020-06-04 LAB — SARS CORONAVIRUS 2 (TAT 6-24 HRS): SARS Coronavirus 2: NEGATIVE

## 2020-06-06 ENCOUNTER — Ambulatory Visit
Admission: RE | Admit: 2020-06-06 | Discharge: 2020-06-06 | Disposition: A | Discharge status: Home / Self Care | Payer: BC Managed Care – PPO | Source: Ambulatory Visit | Attending: Radiation Oncology | Admitting: Radiation Oncology

## 2020-06-06 ENCOUNTER — Encounter: Payer: Self-pay | Admitting: Certified Registered"

## 2020-06-06 ENCOUNTER — Encounter: Payer: Self-pay | Admitting: Radiation Oncology

## 2020-06-06 ENCOUNTER — Ambulatory Visit: Admission: RE | Admit: 2020-06-06 | Payer: BC Managed Care – PPO | Source: Home / Self Care

## 2020-06-06 ENCOUNTER — Other Ambulatory Visit: Payer: Self-pay

## 2020-06-06 VITALS — BP 147/82 | HR 67 | Temp 96.4°F | Wt 220.0 lb

## 2020-06-06 DIAGNOSIS — Z510 Encounter for antineoplastic radiation therapy: Secondary | ICD-10-CM | POA: Insufficient documentation

## 2020-06-06 DIAGNOSIS — C61 Malignant neoplasm of prostate: Secondary | ICD-10-CM | POA: Insufficient documentation

## 2020-06-06 DIAGNOSIS — R9721 Rising PSA following treatment for malignant neoplasm of prostate: Secondary | ICD-10-CM | POA: Diagnosis not present

## 2020-06-06 SURGERY — ULTRASOUND, PROSTATE, FOR VOLUME DETERMINATION
Anesthesia: Choice

## 2020-06-06 NOTE — Progress Notes (Signed)
Radiation Oncology Volume study note  Name: Ray Saunders   Date:   06/06/2020 MRN:  [id number] DOB: [birth year]    This 60 y.o. male presents to the OR today for volume study anticipation of I-125 interstitial implant for Gleason 6 adenocarcinoma  REFERRING PROVIDER: Sofie Hartigan, MD  HPI: Patient is a 60 year old male slowly rising PSA although in the 3 range.  MRI.  Showed a PI-RADS 3 lesion right peripheral zone.  He had multiple biopsies positive for Gleason 6 adenocarcinoma.  Patient opted for I-125 interstitial implant.  He is seen today taken to the OR for volume study in anticipation of implant.  COMPLICATIONS OF TREATMENT: none  FOLLOW UP COMPLIANCE: keeps appointments   PHYSICAL EXAM:  There were no vitals taken for this visit. Well-developed well-nourished patient in NAD. HEENT reveals PERLA, EOMI, discs not visualized.  Oral cavity is clear. No oral mucosal lesions are identified. Neck is clear without evidence of cervical or supraclavicular adenopathy. Lungs are clear to A&P. Cardiac examination is essentially unremarkable with regular rate and rhythm without murmur rub or thrill. Abdomen is benign with no organomegaly or masses noted. Motor sensory and DTR levels are equal and symmetric in the upper and lower extremities. Cranial nerves II through XII are grossly intact. Proprioception is intact. No peripheral adenopathy or edema is identified. No motor or sensory levels are noted. Crude visual fields are within normal range.  RADIOLOGY RESULTS: Ultrasound used for volume study.  PLAN: Patient was taken to the cystoscopy suite in the OR. Patient was placed in the low lithotomy position. Foley catheter was placed. Trans-rectal ultrasound probe was inserted into the rectum and prostate seminal vesicles were visualized as well as bladder base. stepping images were performed on a 5 mm increments. Images will be placed in [employer] treatment planning system to determine  seed placement coordinates for eventual I-125 interstitial implant. Images will be reviewed with the physics and dosimetry staff for final quality approval. I personally was present for the volume study and assisted in delineation of contour volumes.  At the end of the procedure Foley catheter was removed, rectal ultrasound probe was removed. Patient tolerated his procedures extremely well with no side effects or complaints. Patient has given appointment for interstitial implant date. Consent was signed today as well as history and physical performed in preparation for his outpatient surgical implant.     Ray Filbert, MD

## 2020-06-06 NOTE — H&P (View-Only) (Signed)
History and physical  Name: Ray Saunders  MRN: [id number]  Date:   06/06/2020     DOB: Oct 10, 1960   This 60 y.o. male patient presents to the clinic for i history and physical prior to I-125 interstitial implant for Gleason 6 adenocarcinoma the prostate  REFERRING PHYSICIAN: Sofie Hartigan, MD  CHIEF COMPLAINT:  Chief Complaint  Patient presents with  . Follow-up    DIAGNOSIS: There were no encounter diagnoses.   PREVIOUS INVESTIGATIONS:  Clinical notes reviewed Pathology reviewed  HPI: Patient is a 60 year old male originally consulted for Gleason 6 (3+3) adenocarcinoma the prostate.  He then underwent an MRI scan showing a 46 g prostate with a PI-RADS 3 lesion in the right peripheral zone biopsy was positive for Gleason 6 adenocarcinoma.  Was 1 core positive for Gleason 7 (3+4.  He had a Oncotype DX genetic profile with GPS score of 41 showing high risk of disease progression.  He opted for I-125 interstitial implant.  Here today for volume study anticipation of that study he is otherwise doing well he is asymptomatic specifically denies any increased lower urinary tract symptoms.  Patient's PSAs have routinely be in the 3 range.  PLANNED TREATMENT REGIMEN: I-125 interstitial implant  PAST MEDICAL HISTORY:  has a past medical history of Alcohol abuse, Elevated PSA, Elevated PSA, GERD (gastroesophageal reflux disease), History of kidney stones, HLD (hyperlipidemia), and Hypertension.    PAST SURGICAL HISTORY:  Past Surgical History:  Procedure Laterality Date  . COLONOSCOPY WITH PROPOFOL N/A 10/09/2014   Procedure: COLONOSCOPY WITH PROPOFOL;  Surgeon: Lollie Sails, MD;  Location: [facility] ENDOSCOPY;  Service: Endoscopy;  Laterality: N/A;    FAMILY HISTORY: family history includes Kidney failure in his mother; Prostate cancer in his maternal grandfather.  SOCIAL HISTORY:  reports that he quit smoking about 32 years ago. His smoking use included cigarettes. He has never  used smokeless tobacco. He reports that he does not drink alcohol and does not use drugs.  ALLERGIES: Patient has no known allergies.  MEDICATIONS:  Current Outpatient Medications  Medication Sig Dispense Refill  . atorvastatin (LIPITOR) 10 MG tablet Take 10 mg by mouth daily.    [name] lisinopril-hydrochlorothiazide (PRINZIDE,ZESTORETIC) 20-12.5 MG tablet TAKE ONE (1) TABLET EACH DAY  2  . naproxen (NAPROSYN) 500 MG tablet Take 1 tablet (500 mg total) by mouth 2 (two) times daily with a meal. 20 tablet 00  . oxyCODONE-acetaminophen (PERCOCET) 7.5-325 MG tablet Take 1 tablet by mouth every 6 (six) hours as needed for severe pain. 12 tablet 0  . sulfamethoxazole-trimethoprim (BACTRIM DS) 800-160 MG tablet Take 1 tablet by mouth 2 (two) times daily. 20 tablet 0   No current facility-administered medications for this encounter.    ECOG PERFORMANCE STATUS:  0 - Asymptomatic  REVIEW OF SYSTEMS: Patient denies any weight loss, fatigue, weakness, fever, chills or night sweats. Patient denies any loss of vision, blurred vision. Patient denies any ringing  of the ears or hearing loss. No irregular heartbeat. Patient denies heart murmur or history of fainting. Patient denies any chest pain or pain radiating to her upper extremities. Patient denies any shortness of breath, difficulty breathing at night, cough or hemoptysis. Patient denies any swelling in the lower legs. Patient denies any nausea vomiting, vomiting of blood, or coffee ground material in the vomitus. Patient denies any stomach pain. Patient states has had normal bowel movements no significant constipation or diarrhea. Patient denies any dysuria, hematuria or significant nocturia. Patient denies any problems walking,  swelling in the joints or loss of balance. Patient denies any skin changes, loss of hair or loss of weight. Patient denies any excessive worrying or anxiety or significant depression. Patient denies any problems with insomnia. Patient  denies excessive thirst, polyuria, polydipsia. Patient denies any swollen glands, patient denies easy bruising or easy bleeding. Patient denies any recent infections, allergies or URI. Patient "s visual fields have not changed significantly in recent time.   PHYSICAL EXAM: BP (!) 147/82   Pulse 67   Temp (!) 96.4 F (35.8 C) (Tympanic)   Wt 220 lb (99.8 kg)   BMI 35.51 kg/m  Well-developed well-nourished patient in NAD. HEENT reveals PERLA, EOMI, discs not visualized.  Oral cavity is clear. No oral mucosal lesions are identified. Neck is clear without evidence of cervical or supraclavicular adenopathy. Lungs are clear to A&P. Cardiac examination is essentially unremarkable with regular rate and rhythm without murmur rub or thrill. Abdomen is benign with no organomegaly or masses noted. Motor sensory and DTR levels are equal and symmetric in the upper and lower extremities. Cranial nerves II through XII are grossly intact. Proprioception is intact. No peripheral adenopathy or edema is identified. No motor or sensory levels are noted. Crude visual fields are within normal range.  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  Risks and benefits of treatment occluding increased lower Neri tract symptoms diarrhea fatigue risks of general anesthesia all reviewed in detail with the patient.  I have also reviewed radiation safety precautions.  Patient comprehends her recommendations well.  Volume study was performed successfully.  I would like to take this opportunity to thank you for allowing me to participate in the care of your patient.Noreene Filbert, MD

## 2020-06-06 NOTE — H&P (Signed)
History and physical  Name: Ray Saunders  MRN: [id number]  Date:   06/06/2020     DOB: 1960-10-26   This 60 y.o. male patient presents to the clinic for i history and physical prior to I-125 interstitial implant for Gleason 6 adenocarcinoma the prostate  REFERRING PHYSICIAN: Sofie Hartigan, MD  CHIEF COMPLAINT:  Chief Complaint  Patient presents with  . Follow-up    DIAGNOSIS: There were no encounter diagnoses.   PREVIOUS INVESTIGATIONS:  Clinical notes reviewed Pathology reviewed  HPI: Patient is a 60 year old male originally consulted for Gleason 6 (3+3) adenocarcinoma the prostate.  He then underwent an MRI scan showing a 46 g prostate with a PI-RADS 3 lesion in the right peripheral zone biopsy was positive for Gleason 6 adenocarcinoma.  Was 1 core positive for Gleason 7 (3+4.  He had a Oncotype DX genetic profile with GPS score of 41 showing high risk of disease progression.  He opted for I-125 interstitial implant.  Here today for volume study anticipation of that study he is otherwise doing well he is asymptomatic specifically denies any increased lower urinary tract symptoms.  Patient's PSAs have routinely be in the 3 range.  PLANNED TREATMENT REGIMEN: I-125 interstitial implant  PAST MEDICAL HISTORY:  has a past medical history of Alcohol abuse, Elevated PSA, Elevated PSA, GERD (gastroesophageal reflux disease), History of kidney stones, HLD (hyperlipidemia), and Hypertension.    PAST SURGICAL HISTORY:  Past Surgical History:  Procedure Laterality Date  . COLONOSCOPY WITH PROPOFOL N/A 10/09/2014   Procedure: COLONOSCOPY WITH PROPOFOL;  Surgeon: Lollie Sails, MD;  Location: [facility] ENDOSCOPY;  Service: Endoscopy;  Laterality: N/A;    FAMILY HISTORY: family history includes Kidney failure in his mother; Prostate cancer in his maternal grandfather.  SOCIAL HISTORY:  reports that he quit smoking about 32 years ago. His smoking use included cigarettes. He has never  used smokeless tobacco. He reports that he does not drink alcohol and does not use drugs.  ALLERGIES: Patient has no known allergies.  MEDICATIONS:  Current Outpatient Medications  Medication Sig Dispense Refill  . atorvastatin (LIPITOR) 10 MG tablet Take 10 mg by mouth daily.    [name] lisinopril-hydrochlorothiazide (PRINZIDE,ZESTORETIC) 20-12.5 MG tablet TAKE ONE (1) TABLET EACH DAY  2  . naproxen (NAPROSYN) 500 MG tablet Take 1 tablet (500 mg total) by mouth 2 (two) times daily with a meal. 20 tablet 00  . oxyCODONE-acetaminophen (PERCOCET) 7.5-325 MG tablet Take 1 tablet by mouth every 6 (six) hours as needed for severe pain. 12 tablet 0  . sulfamethoxazole-trimethoprim (BACTRIM DS) 800-160 MG tablet Take 1 tablet by mouth 2 (two) times daily. 20 tablet 0   No current facility-administered medications for this encounter.    ECOG PERFORMANCE STATUS:  0 - Asymptomatic  REVIEW OF SYSTEMS: Patient denies any weight loss, fatigue, weakness, fever, chills or night sweats. Patient denies any loss of vision, blurred vision. Patient denies any ringing  of the ears or hearing loss. No irregular heartbeat. Patient denies heart murmur or history of fainting. Patient denies any chest pain or pain radiating to her upper extremities. Patient denies any shortness of breath, difficulty breathing at night, cough or hemoptysis. Patient denies any swelling in the lower legs. Patient denies any nausea vomiting, vomiting of blood, or coffee ground material in the vomitus. Patient denies any stomach pain. Patient states has had normal bowel movements no significant constipation or diarrhea. Patient denies any dysuria, hematuria or significant nocturia. Patient denies any problems walking,  swelling in the joints or loss of balance. Patient denies any skin changes, loss of hair or loss of weight. Patient denies any excessive worrying or anxiety or significant depression. Patient denies any problems with insomnia. Patient  denies excessive thirst, polyuria, polydipsia. Patient denies any swollen glands, patient denies easy bruising or easy bleeding. Patient denies any recent infections, allergies or URI. Patient "s visual fields have not changed significantly in recent time.   PHYSICAL EXAM: BP (!) 147/82   Pulse 67   Temp (!) 96.4 F (35.8 C) (Tympanic)   Wt 220 lb (99.8 kg)   BMI 35.51 kg/m  Well-developed well-nourished patient in NAD. HEENT reveals PERLA, EOMI, discs not visualized.  Oral cavity is clear. No oral mucosal lesions are identified. Neck is clear without evidence of cervical or supraclavicular adenopathy. Lungs are clear to A&P. Cardiac examination is essentially unremarkable with regular rate and rhythm without murmur rub or thrill. Abdomen is benign with no organomegaly or masses noted. Motor sensory and DTR levels are equal and symmetric in the upper and lower extremities. Cranial nerves II through XII are grossly intact. Proprioception is intact. No peripheral adenopathy or edema is identified. No motor or sensory levels are noted. Crude visual fields are within normal range.  LABORATORY DATA: Pathology report reviewed    RADIOLOGY RESULTS: Ultrasound guidance used today for volume study   IMPRESSION: Stage IIa adenocarcinoma the prostate Gleason 6 (46+67) in 60 year old male  PLAN: This time patient is cleared to go ahead with I-125 interstitial implant.  Risks and benefits of treatment occluding increased lower Neri tract symptoms diarrhea fatigue risks of general anesthesia all reviewed in detail with the patient.  I have also reviewed radiation safety precautions.  Patient comprehends her recommendations well.  Volume study was performed successfully.  I would like to take this opportunity to thank you for allowing me to participate in the care of your patient.Noreene Filbert, MD

## 2020-06-07 ENCOUNTER — Ambulatory Visit: Payer: BC Managed Care – PPO | Admitting: Radiation Oncology

## 2020-06-25 ENCOUNTER — Inpatient Hospital Stay
Admission: RE | Admit: 2020-06-25 | Discharge: 2020-06-25 | Disposition: A | Discharge status: Home / Self Care | Payer: BC Managed Care – PPO | Source: Ambulatory Visit

## 2020-06-25 HISTORY — DX: Malignant (primary) neoplasm, unspecified: C80.1

## 2020-06-25 NOTE — Patient Instructions (Addendum)
INSTRUCTIONS FOR SURGERY     Your surgery is scheduled for:   Monday, MAY 9TH     To find out your arrival time for the day of surgery,          please call [phone] between 1 pm and 3 pm on :  Friday, MAY 6TH     When you arrive for surgery, report to the [place].  ONCE THEY HAVE COMPLETED THEIR PROCESS, THEN GO TO THE SECOND       FLOOR AND SIGN IN AT [place].    REMEMBER: Instructions that are not followed completely may result in serious medical risk,  up to and including death, or upon the discretion of your surgeon and anesthesiologist,            your surgery may need to be rescheduled.  __X__ 1. Do not eat food after midnight the night before your procedure.                    No gum, candy, lozenger, tic tacs, tums or hard candies.                  ABSOLUTELY NOTHING SOLID IN YOUR MOUTH AFTER MIDNIGHT                    You may drink unlimited clear liquids up to 2 hours before you are scheduled to arrive for surgery.                   Do not drink anything within those 2 hours unless you need to take medicine, then take the                   smallest amount you need.  Clear liquids include:  water, apple juice without pulp,                   any flavor Gatorade, Black coffee, black tea.  Sugar may be added but no dairy/ honey /lemon.                        Broth and jello is not considered a clear liquid.  __x__  2. On the morning of surgery, please brush your teeth with toothpaste and water. You may rinse with                  mouthwash if you wish but DO NOT SWALLOW TOOTHPASTE OR MOUTHWASH  __X___3. NO alcohol for 24 hours before or after surgery.  __x___ 4.  Do NOT smoke or use e-cigarettes for 24 HOURS PRIOR TO SURGERY.                      DO NOT Use any chewable tobacco products for at least 6 hours prior to surgery.  __x___ 5. If you start any  new medication after this appointment and prior to surgery, please                   Bring  it with you on the day of surgery.  ___x__ 6. Notify your doctor if there is any change in your medical condition, such as fever,                  infection, vomitting, diarrhea or any open sores.  __x___ 7.  USE the CHG SOAP as instructed, the night before surgery and the day of surgery.                   Once you have washed with this soap, do NOT use any of the following: Powders, perfumes                    or lotions. Please do not wear make up, hairpins, clips or nail polish. You may wear deodorant.                   Men may shave their face and neck.  On the night before surgery, please get into clean                    Pajamas and clean sheets.  If you have any pets, please do not let them sleep in bed with you.                                     DO NOT wear ANY jewelry on the day of surgery. If there are rings that are too tight to                    remove easily, please address this prior to the surgery day. Piercings need to be removed.                                                                     NO METAL ON YOUR BODY.                    Do NOT bring any valuables.  If you came to Pre-Admit testing then you will not need license,                     insurance card or credit card.  If you will be staying overnight, please either leave your things in                     the car or have your family be responsible for these items.                      IS NOT RESPONSIBLE FOR BELONGINGS OR VALUABLES.  ___X__ 8. DO NOT wear contact lenses on surgery day.  You may not have dentures,                     Hearing aides, contacts or glasses in the operating room. These items can be                    Placed in the Recovery Room to receive immediately after surgery.  __x___ 9. IF YOU ARE SCHEDULED TO GO HOME ON  THE SAME DAY, YOU MUST                   Have someone to drive you home  and to stay with you  for the first 24 hours.                    Have an arrangement prior to arriving on surgery day.  ___x__ 10. Take the following medications on the morning of surgery with a sip of water:                              1. PERCOCET, if you need it                     2. LIPITOR                      3.                    __X__  12. STOP ALL ASPIRIN PRODUCTS AS OF TODAY, MAY 2ND                       THIS INCLUDES BC POWDERS / GOODIES POWDER  __x___ 13. STOP Anti-inflammatories as of TODAY, MAY 2ND.                      This includes IBUPROFEN / MOTRIN / ADVIL / ALEVE/ NAPROXYN                    YOU MAY TAKE TYLENOL ANY TIME PRIOR TO SURGERY.  ___X__ 12.  Stop supplements until after surgery.                      ____X__17.  Continue to take the following medications but do not take on the morning of surgery:                         PRINZIDE  ___X___18.Wear clean and comfortable clothing to the hospital.  HAVE PHONE NUMBERS FOR YOUR CONTACT PEOPLE.  BRING A CELL PHONE AND CHARGER. YOU AND YOUR DRIVER MUST HAVE A MASK ON AT [place].  USE THE ENEMA ON THE MORNING OF SURGERY, SHORTLY PRIOR TO ARRIVAL.

## 2020-06-26 ENCOUNTER — Other Ambulatory Visit: Payer: Self-pay

## 2020-06-26 ENCOUNTER — Other Ambulatory Visit
Admission: RE | Admit: 2020-06-26 | Discharge: 2020-06-26 | Disposition: A | Discharge status: Home / Self Care | Payer: BC Managed Care – PPO | Source: Ambulatory Visit | Attending: Urology | Admitting: Urology

## 2020-06-26 NOTE — Patient Instructions (Addendum)
Your procedure is scheduled on: Monday Jul 02, 2020. Report to Day Surgery inside [facility] 2nd floor (stop by admissions desk first before getting on elevator). To find out your arrival time please call [phone] between 1PM - 3PM on Friday Jun 29, 2020.  Remember: Instructions that are not followed completely may result in serious medical risk,  up to and including death, or upon the discretion of your surgeon and anesthesiologist your  surgery may need to be rescheduled.     _X__ 1. Do not eat food after midnight the night before your procedure.                 No chewing gum or hard candies.                 __X__2.  On the morning of surgery brush your teeth with toothpaste and water, you                may rinse your mouth with mouthwash if you wish.  Do not swallow any toothpaste of mouthwash.     _X__ 3.  No Alcohol for 24 hours before or after surgery.   _X__ 4.  Do Not Smoke or use e-cigarettes For 24 Hours Prior to Your Surgery.                 Do not use any chewable tobacco products for at least 6 hours prior to                 Surgery.  _X__  5.  Do not use any recreational drugs (marijuana, cocaine, heroin, ecstasy, MDMA or other)                For at least one week prior to your surgery.  Combination of these drugs with anesthesia                May have life threatening results.   __X__ 6.  Notify your doctor if there is any change in your medical condition      (cold, fever, infections).     Do not wear jewelry, make-up, hairpins, clips or nail polish. Do not wear lotions, powders, or perfumes. You may wear deodorant. Do not shave 48 hours prior to surgery. Men may shave face and neck. Do not bring valuables to the hospital.    [facility] is not responsible for any belongings or valuables.  Contacts, dentures or bridgework may not be worn into surgery. Leave your suitcase in the car. After surgery it may be brought to your room. For  patients admitted to the hospital, discharge time is determined by your treatment team.   Patients discharged the day of surgery will not be allowed to drive home.   Make arrangements for someone to be with you for the first 24 hours of your Same Day Discharge.   __X__ Take these medicines the morning of surgery with A SIP OF WATER:    1. None   2.   3.   4.  5.  6.  __X__ Fleet Enema (as directed) sue it the morning of surgery ar 6:00a  ____ Use CHG Soap (or wipes) as directed  ____ Use Benzoyl Peroxide Gel as instructed  ____ Use inhalers on the day of surgery  ____ Stop metformin 2 days prior to surgery    ____ Take 1/2 of usual insulin dose the night before surgery. No insulin the morning  of surgery.   ____ Call your PCP, cardiologist, or Pulmonologist if taking Coumadin/Plavix/aspirin and ask when to stop before your surgery.   __X__ One Week prior to surgery- Stop Anti-inflammatories such as Ibuprofen, Aleve, Advil, Motrin, meloxicam (MOBIC), diclofenac, etodolac, ketorolac, Toradol, Daypro, piroxicam, Goody's or BC powders. OK TO USE TYLENOL IF NEEDED   __X__ Stop supplements until after surgery.    ____ Bring C-Pap to the hospital.    If you have any questions regarding your pre-procedure instructions,  Please call Pre-admit Testing at [phone].

## 2020-06-28 ENCOUNTER — Other Ambulatory Visit
Admission: RE | Admit: 2020-06-28 | Discharge: 2020-06-28 | Disposition: A | Discharge status: Home / Self Care | Payer: BC Managed Care – PPO | Source: Ambulatory Visit | Attending: Urology | Admitting: Urology

## 2020-06-28 ENCOUNTER — Other Ambulatory Visit: Payer: Self-pay

## 2020-06-28 ENCOUNTER — Other Ambulatory Visit: Payer: BC Managed Care – PPO

## 2020-06-28 DIAGNOSIS — Z01818 Encounter for other preprocedural examination: Secondary | ICD-10-CM | POA: Diagnosis present

## 2020-06-28 LAB — CBC
HCT: 48.8 % (ref 39.0–52.0)
Hemoglobin: 17.4 g/dL — ABNORMAL HIGH (ref 13.0–17.0)
MCH: 30.9 pg (ref 26.0–34.0)
MCHC: 35.7 g/dL (ref 30.0–36.0)
MCV: 86.7 fL (ref 80.0–100.0)
Platelets: 289 10*3/uL (ref 150–400)
RBC: 5.63 MIL/uL (ref 4.22–5.81)
RDW: 13 % (ref 11.5–15.5)
WBC: 6.7 10*3/uL (ref 4.0–10.5)
nRBC: 0 % (ref 0.0–0.2)

## 2020-06-28 LAB — BASIC METABOLIC PANEL
Anion gap: 10 (ref 5–15)
BUN: 18 mg/dL (ref 8–23)
CO2: 24 mmol/L (ref 22–32)
Calcium: 9.3 mg/dL (ref 8.9–10.3)
Chloride: 103 mmol/L (ref 98–111)
Creatinine, Ser: 1.23 mg/dL (ref 0.61–1.24)
GFR, Estimated: >60 mL/min (ref >60)
Glucose, Bld: 94 mg/dL (ref 70–99)
Potassium: 3.4 mmol/L — ABNORMAL LOW (ref 3.5–5.1)
Sodium: 137 mmol/L (ref 135–145)

## 2020-06-28 NOTE — Progress Notes (Signed)
  Perioperative Services Pre-Admission/Anesthesia Testing   Date: 06/28/20 Name: Ray Saunders MRN:   [id number]  Re: Consideration of preoperative prophylactic antibiotic change   Request sent to: Hollice Espy, MD (routed and/or faxed via [facility])  Planned Surgical Procedure(s):    Case: [id number] Date/Time: 07/02/20 0730   Procedure: RADIOACTIVE SEED IMPLANT/BRACHYTHERAPY IMPLANT (N/A )   Anesthesia type: General   Pre-op diagnosis: prostate cancer   Location: [facility] 10 / [facility] ORS FOR ANESTHESIA GROUP   Surgeons: Hollice Espy, MD    Notes: 1. Patient has NO ocumented allergy to PCN   Request:  As an evidence based approach to reducing the rate of incidence for post-operative SSI and the development of MDROs, could an agent with narrower coverage for preoperative prophylaxis in this patient's upcoming surgical course be considered?   1. Currently ordered preoperative prophylactic ABX: ciprofloxacin.   2. Specifically requesting change to cephalosporin (CEFAZOLIN).   3. Please communicate decision with me and I will change the orders in Epic as per your direction.   Citation: Beckie Salts, Wardell Heath et al: Best practice statement on urologic procedures and antimicrobial prophylaxis. J Urol 2020; 203: 351.   Honor Loh, MSN, APRN, FNP-C, CEN [facility]  Peri-operative Services Nurse Practitioner FAX: [phone] 06/28/20 9:12 AM

## 2020-07-02 ENCOUNTER — Ambulatory Visit: Payer: BC Managed Care – PPO | Admitting: Registered Nurse

## 2020-07-02 ENCOUNTER — Ambulatory Visit: Payer: BC Managed Care – PPO | Admitting: Urgent Care

## 2020-07-02 ENCOUNTER — Encounter
Admission: RE | Disposition: A | Discharge status: Home / Self Care | Payer: Self-pay | Source: Home / Self Care | Attending: Urology

## 2020-07-02 ENCOUNTER — Ambulatory Visit: Payer: BC Managed Care – PPO

## 2020-07-02 ENCOUNTER — Ambulatory Visit
Admission: RE | Admit: 2020-07-02 | Discharge: 2020-07-02 | Disposition: A | Discharge status: Home / Self Care | Payer: BC Managed Care – PPO | Attending: Urology | Admitting: Urology

## 2020-07-02 ENCOUNTER — Encounter: Payer: Self-pay | Admitting: Urology

## 2020-07-02 DIAGNOSIS — Z87891 Personal history of nicotine dependence: Secondary | ICD-10-CM | POA: Diagnosis not present

## 2020-07-02 DIAGNOSIS — Z79899 Other long term (current) drug therapy: Secondary | ICD-10-CM | POA: Insufficient documentation

## 2020-07-02 DIAGNOSIS — Z791 Long term (current) use of non-steroidal anti-inflammatories (NSAID): Secondary | ICD-10-CM | POA: Insufficient documentation

## 2020-07-02 DIAGNOSIS — N210 Calculus in bladder: Secondary | ICD-10-CM | POA: Diagnosis not present

## 2020-07-02 DIAGNOSIS — C61 Malignant neoplasm of prostate: Secondary | ICD-10-CM | POA: Insufficient documentation

## 2020-07-02 HISTORY — PX: RADIOACTIVE SEED IMPLANT: SHX5150

## 2020-07-02 SURGERY — INSERTION, RADIATION SOURCE, PROSTATE
Anesthesia: General

## 2020-07-02 MED ORDER — DEXAMETHASONE SODIUM PHOSPHATE 10 MG/ML IJ SOLN
INTRAMUSCULAR | Status: AC
  Filled 2020-07-02: qty 1

## 2020-07-02 MED ORDER — ONDANSETRON HCL 4 MG/2ML IJ SOLN
INTRAMUSCULAR | Status: DC | PRN
  Administered 2020-07-02: 4 mg via INTRAVENOUS

## 2020-07-02 MED ORDER — TAMSULOSIN HCL 0.4 MG PO CAPS: 0.4000 mg | ORAL_CAPSULE | Freq: Every day | ORAL | 0 refills | Status: DC

## 2020-07-02 MED ORDER — ROCURONIUM BROMIDE 100 MG/10ML IV SOLN
INTRAVENOUS | Status: DC | PRN
  Administered 2020-07-02: 50 mg via INTRAVENOUS

## 2020-07-02 MED ORDER — FLEET ENEMA 7-19 GM/118ML RE ENEM: 1.0000 | ENEMA | Freq: Once | RECTAL | Status: DC

## 2020-07-02 MED ORDER — ONDANSETRON HCL 4 MG/2ML IJ SOLN
INTRAMUSCULAR | Status: AC
  Filled 2020-07-02: qty 2

## 2020-07-02 MED ORDER — CHLORHEXIDINE GLUCONATE 0.12 % MT SOLN: 15.0000 mL | Freq: Once | OROMUCOSAL | Status: AC

## 2020-07-02 MED ORDER — FAMOTIDINE 20 MG PO TABS
ORAL_TABLET | ORAL | Status: AC
  Administered 2020-07-02: 20 mg via ORAL
  Filled 2020-07-02: qty 1

## 2020-07-02 MED ORDER — KETOROLAC TROMETHAMINE 30 MG/ML IJ SOLN
INTRAMUSCULAR | Status: DC | PRN
  Administered 2020-07-02: 30 mg via INTRAVENOUS

## 2020-07-02 MED ORDER — PROPOFOL 10 MG/ML IV BOLUS
INTRAVENOUS | Status: DC | PRN
  Administered 2020-07-02: 150 mg via INTRAVENOUS
  Administered 2020-07-02: 50 mg via INTRAVENOUS

## 2020-07-02 MED ORDER — SUCCINYLCHOLINE CHLORIDE 200 MG/10ML IV SOSY
PREFILLED_SYRINGE | INTRAVENOUS | Status: AC
  Filled 2020-07-02: qty 10

## 2020-07-02 MED ORDER — BACITRACIN 500 UNIT/GM EX OINT
TOPICAL_OINTMENT | CUTANEOUS | Status: DC | PRN
  Administered 2020-07-02: 1 via TOPICAL

## 2020-07-02 MED ORDER — FENTANYL CITRATE (PF) 100 MCG/2ML IJ SOLN
INTRAMUSCULAR | Status: AC
  Filled 2020-07-02: qty 2

## 2020-07-02 MED ORDER — ORAL CARE MOUTH RINSE: 15.0000 mL | Freq: Once | OROMUCOSAL | Status: AC

## 2020-07-02 MED ORDER — ROCURONIUM BROMIDE 10 MG/ML (PF) SYRINGE
PREFILLED_SYRINGE | INTRAVENOUS | Status: AC
  Filled 2020-07-02: qty 10

## 2020-07-02 MED ORDER — FENTANYL CITRATE (PF) 100 MCG/2ML IJ SOLN: 25.0000 ug | INTRAMUSCULAR | Status: DC | PRN

## 2020-07-02 MED ORDER — PROPOFOL 10 MG/ML IV BOLUS
INTRAVENOUS | Status: AC
  Filled 2020-07-02: qty 40

## 2020-07-02 MED ORDER — LIDOCAINE HCL (PF) 2 % IJ SOLN
INTRAMUSCULAR | Status: AC
  Filled 2020-07-02: qty 5

## 2020-07-02 MED ORDER — CHLORHEXIDINE GLUCONATE 0.12 % MT SOLN
OROMUCOSAL | Status: AC
  Administered 2020-07-02: 15 mL via OROMUCOSAL
  Filled 2020-07-02: qty 15

## 2020-07-02 MED ORDER — CIPROFLOXACIN IN D5W 400 MG/200ML IV SOLN
400.0000 mg | INTRAVENOUS | Status: AC
  Administered 2020-07-02: 400 mg via INTRAVENOUS

## 2020-07-02 MED ORDER — ACETAMINOPHEN 10 MG/ML IV SOLN
INTRAVENOUS | Status: AC
  Filled 2020-07-02: qty 100

## 2020-07-02 MED ORDER — FAMOTIDINE 20 MG PO TABS: 20.0000 mg | ORAL_TABLET | Freq: Once | ORAL | Status: AC

## 2020-07-02 MED ORDER — HYDROCODONE-ACETAMINOPHEN 5-325 MG PO TABS: 1.0000 | ORAL_TABLET | Freq: Four times a day (QID) | ORAL | 0 refills | Status: DC | PRN

## 2020-07-02 MED ORDER — ONDANSETRON HCL 4 MG/2ML IJ SOLN: 4.0000 mg | Freq: Once | INTRAMUSCULAR | Status: DC | PRN

## 2020-07-02 MED ORDER — FENTANYL CITRATE (PF) 100 MCG/2ML IJ SOLN
INTRAMUSCULAR | Status: DC | PRN
  Administered 2020-07-02: 50 ug via INTRAVENOUS
  Administered 2020-07-02: 25 ug via INTRAVENOUS
  Administered 2020-07-02: 50 ug via INTRAVENOUS
  Administered 2020-07-02: 25 ug via INTRAVENOUS
  Administered 2020-07-02: 50 ug via INTRAVENOUS

## 2020-07-02 MED ORDER — LIDOCAINE HCL (CARDIAC) PF 100 MG/5ML IV SOSY
PREFILLED_SYRINGE | INTRAVENOUS | Status: DC | PRN
  Administered 2020-07-02: 100 mg via INTRAVENOUS

## 2020-07-02 MED ORDER — CIPROFLOXACIN IN D5W 400 MG/200ML IV SOLN
INTRAVENOUS | Status: AC
  Filled 2020-07-02: qty 200

## 2020-07-02 MED ORDER — KETOROLAC TROMETHAMINE 30 MG/ML IJ SOLN
INTRAMUSCULAR | Status: AC
  Filled 2020-07-02: qty 1

## 2020-07-02 MED ORDER — DEXAMETHASONE SODIUM PHOSPHATE 10 MG/ML IJ SOLN
INTRAMUSCULAR | Status: DC | PRN
  Administered 2020-07-02: 10 mg via INTRAVENOUS

## 2020-07-02 MED ORDER — SUGAMMADEX SODIUM 200 MG/2ML IV SOLN
INTRAVENOUS | Status: DC | PRN
  Administered 2020-07-02: 200 mg via INTRAVENOUS

## 2020-07-02 MED ORDER — LACTATED RINGERS IV SOLN: INTRAVENOUS | Status: DC

## 2020-07-02 MED ORDER — BACITRACIN ZINC 500 UNIT/GM EX OINT
TOPICAL_OINTMENT | CUTANEOUS | Status: AC
  Filled 2020-07-02: qty 28.35

## 2020-07-02 MED ORDER — ACETAMINOPHEN 10 MG/ML IV SOLN
INTRAVENOUS | Status: DC | PRN
  Administered 2020-07-02: 1000 mg via INTRAVENOUS

## 2020-07-02 SURGICAL SUPPLY — 24 items
BAG DRN RND TRDRP ANRFLXCHMBR (UROLOGICAL SUPPLIES) ×1
BAG URINE DRAIN 2000ML AR STRL (UROLOGICAL SUPPLIES) ×2 IMPLANT
BLADE CLIPPER SURG (BLADE) ×2 IMPLANT
CATH FOL 2WAY LX 16X5 (CATHETERS) ×2 IMPLANT
COVER BACK TABLE REUSABLE LG (DRAPES) ×2 IMPLANT
DRAPE INCISE 23X17 IOBAN STRL (DRAPES) ×1
DRAPE INCISE IOBAN 23X17 STRL (DRAPES) ×1 IMPLANT
DRAPE UNDER BUTTOCK W/FLU (DRAPES) ×2 IMPLANT
DRSG TELFA 3X8 NADH (GAUZE/BANDAGES/DRESSINGS) ×2 IMPLANT
GLOVE SURG ENC MOIS LTX SZ6.5 (GLOVE) ×4 IMPLANT
GLOVE SURG ENC MOIS LTX SZ7.5 (GLOVE) ×2 IMPLANT
GOWN STRL REUS W/ TWL LRG LVL3 (GOWN DISPOSABLE) ×2 IMPLANT
GOWN STRL REUS W/ TWL XL LVL3 (GOWN DISPOSABLE) ×1 IMPLANT
GOWN STRL REUS W/TWL LRG LVL3 (GOWN DISPOSABLE) ×4
GOWN STRL REUS W/TWL XL LVL3 (GOWN DISPOSABLE) ×2
IV NS 1000ML (IV SOLUTION) ×2
IV NS 1000ML BAXH (IV SOLUTION) ×1 IMPLANT
KIT TURNOVER CYSTO (KITS) ×2 IMPLANT
MANIFOLD NEPTUNE II (INSTRUMENTS) ×2 IMPLANT
PACK CYSTO AR (MISCELLANEOUS) ×2 IMPLANT
SET CYSTO W/LG BORE CLAMP LF (SET/KITS/TRAYS/PACK) ×2 IMPLANT
SURGILUBE 2OZ TUBE FLIPTOP (MISCELLANEOUS) ×2 IMPLANT
SYR 10ML LL (SYRINGE) ×2 IMPLANT
WATER STERILE IRR 1000ML POUR (IV SOLUTION) ×2 IMPLANT

## 2020-07-02 NOTE — Discharge Instructions (Signed)
Brachytherapy for Prostate Cancer, Care After This sheet gives you information about how to care for yourself after your procedure. Your health care provider may also give you more specific instructions. If you have problems or questions, contact your health care provider. What can I expect after the procedure? After the procedure, it is common to have:  Urinary symptoms. These may include: ? Trouble passing urine. ? Blood in the urine or semen. ? Frequent feeling of an urgent need to urinate.  Constipation, nausea, or bloating and gas.  Bruising, swelling, and tenderness of the area beneath the scrotum (perineum).  Tiredness (fatigue).  Burning or pain in the rectum.  Problems getting or keeping an erection (erectile dysfunction). Follow these instructions at home: Eating and drinking  Drink enough fluid to keep your urine pale yellow.  Eat a healthy, balanced diet. This includes lean proteins, whole grains, and plenty of fruits and vegetables.  If you drink alcohol: ? Limit how much you have to 0-2 drinks a day. ? Be aware of how much alcohol is in your drink. In the [place] one drink equals one 12 oz bottle of beer (355 mL), one 5 oz glass of wine (148 mL), or one 1 oz glass of hard liquor (44 mL).   Managing pain, stiffness, and swelling  If directed, put ice on the affected area. To do this: ? Put ice in a plastic bag. ? Place a towel between your skin and the bag. ? Leave the ice on for 20 minutes, 2-3 times a day.  Try not to sit directly on the perineum. A soft cushion can help with discomfort.   Activity  If you were given a sedative during the procedure, it can affect you for several hours. Do not drive or operate machinery until your health care provider says that it is safe.  Do not lift anything that is heavier than 10 lb (4.5 kg), or the limit that you are told, until your health care provider says that it is safe.  Rest as told by your health care  provider.  Return to your normal activities as told by your health care provider. Most people can return to normal activities a few days or weeks after the procedure. Ask your health care provider what activities are safe for you.   Treatment area care Check your treatment area every day for signs of infection. Check for:  Redness, swelling, or pain.  Fluid or blood.  Warmth.  Pus or a bad smell. Managing constipation Your procedure may cause constipation. To prevent or treat constipation, you may need to:  Take over-the-counter or prescription medicines.  Eat foods that are high in fiber, such as beans, whole grains, and fresh fruits and vegetables.  Limit foods that are high in fat and processed sugars, such as fried or sweet foods. General instructions  Take over-the-counter and prescription medicines only as told by your health care provider.  Do not take baths, swim, or use a hot tub until your health care provider approves. Shower and wash the perineum gently.  Do not have sex for one week after the treatment, or until your health care provider approves.  Do not use any products that contain nicotine or tobacco, such as cigarettes, e-cigarettes, and chewing tobacco. If you need help quitting, ask your health care provider.  If you have permanent, low-dose brachytherapy implants: ? Limit close contact with children and pregnant women for 2 months or as told by your health care provider. This  is important because of the radiation that is still active in the prostate. ? You may set off radioactive sensors, such as at airport screenings. Ask your health care provider for a document that explains your treatment. ? You may be told to use a condom during sex for the first 2 months after low-dose brachytherapy.  Keep all follow-up visits as told by your health care provider. This is important. You may still need additional treatment. Contact a health care provider if you:  Have a  fever or chills.  Have any of these signs of infection in the treatment area: ? Redness, swelling, or pain. ? Fluid or blood. ? Warmth. ? Pus or a bad smell.  Have no bowel movements for 3-4 days after the procedure.  Have diarrhea for 3-4 days after the procedure.  Develop any new symptoms, such as problems with urinating or erectile dysfunction.  Have pain in your abdomen.  Have more blood in your urine.  Have swelling or pain in your legs. Get help right away if:  You cannot urinate.  You have a lot of bleeding from your rectum.  You have unusual drainage coming from your rectum.  You have severe pain in the treated area that does not go away with pain medicine.  You have severe nausea or vomiting.  You have difficulty breathing. Summary  Talk with your health care provider about your risk of brachytherapy side effects, such as erectile dysfunction or urinary problems.  If you have permanent, low-dose brachytherapy implants, limit close contact with children and pregnant women for 2 months or as told by your health care provider. This is important because of the radiation that is still active in the prostate.  You may be told to use a condom during sex for the first 2 months after low-dose brachytherapy.  Keep all follow-up visits as told by your health care provider. This is important. You may need additional treatment. This information is not intended to replace advice given to you by your health care provider. Make sure you discuss any questions you have with your health care provider. Document Revised: 12/13/2018 Document Reviewed: 12/13/2018 Elsevier Patient Education  2021 [place]   1) The drugs that you were given will stay in your system until tomorrow so for the next 24 hours you should not:  A) Drive an automobile B) Make any legal decisions C) Drink any alcoholic beverage   2) You may resume  regular meals tomorrow.  Today it is better to start with liquids and gradually work up to solid foods.  You may eat anything you prefer, but it is better to start with liquids, then soup and crackers, and gradually work up to solid foods.   3) Please notify your doctor immediately if you have any unusual bleeding, trouble breathing, redness and pain at the surgery site, drainage, fever, or pain not relieved by medication.  4) Your post-operative visit with Dr.                                     is: Date:                        Time:    Please call to schedule your post-operative visit.  5) Additional Instructions:

## 2020-07-02 NOTE — Progress Notes (Signed)
Radiation Oncology I-125 interstitial implant note  Name: Ray Saunders   Date:   05/04/2020 MRN:  [id number] DOB: [birth year]    This 60 y.o. male presents to the OR for I-125 interstitial implant for Gleason 6 (3+3) adenocarcinoma the prostate  REFERRING PROVIDER: No ref. provider found  HPI: Patient is a 59 year old male taken to the OR today for I-125 interstitial implant for Gleason 6 (3+3) adenocarcinoma the prostate who had an Oncotype DX genetic profile showing a GPS score of 41 showing high risk of disease progression.[name]  His PSAs have routinely been in the 3 range.  COMPLICATIONS OF TREATMENT: none  FOLLOW UP COMPLIANCE: keeps appointments   PHYSICAL EXAM:  BP (!) 156/88   Pulse 60   Temp (!) 96.9 F (36.1 C) (Temporal)   Resp 18   Ht 5\' 6"  (1.676 m)   Wt 220 lb (99.8 kg)   SpO2 100%   BMI 35.51 kg/m  Well-developed well-nourished patient in NAD. HEENT reveals PERLA, EOMI, discs not visualized.  Oral cavity is clear. No oral mucosal lesions are identified. Neck is clear without evidence of cervical or supraclavicular adenopathy. Lungs are clear to A&P. Cardiac examination is essentially unremarkable with regular rate and rhythm without murmur rub or thrill. Abdomen is benign with no organomegaly or masses noted. Motor sensory and DTR levels are equal and symmetric in the upper and lower extremities. Cranial nerves II through XII are grossly intact. Proprioception is intact. No peripheral adenopathy or edema is identified. No motor or sensory levels are noted. Crude visual fields are within normal range.  RADIOLOGY RESULTS: Ultrasound used for source placement  PLAN: Patient was taken to the operating room and general anesthesia was administered. Legs were immobilized in stirrups and patient was positioned in the exact same proportions as original volume study. Patient was prepped and Foley catheter was placed. Ultrasound guidance identified the prostate and recreated the  original set up as per treatment planning volume study.  34 needles were placed under ultrasound guidance with PVCs delivered to the prostate volume. After completion of procedure cystoscopy was performed by urology and no evidence of seeds in the bladder were noted. Patient tolerated the procedure extremely well. Initial plain film as doublecheck identified 93 seeds in the prostate. Patient has followup appointment in one month for CT scan for quality assurance will be performed.  Noreene Filbert, MD

## 2020-07-02 NOTE — Op Note (Signed)
Preoperative diagnosis: Adenocarcinoma of the prostate   Postoperative diagnosis: Same   Procedure: I-125 prostate seed implantation, cystoscopy, removal of bladder stone  Surgeon: Hollice Espy M.D.  Radiation Oncology: Lavena Stanford, M.D.   Anesthesia: General  Drains: none  Complications: none  Indications: Favorable intermediate risk prostate cancer  Procedure: Patient was brought to operating suite and placement table in the supine position. At this time, a universal timeout protocol was performed, all team members were identified, Venodyne boots are placed, and he was administered IV Cipro in the preoperative period. He was placed in lithotomy position and prepped and draped in usual manner. Radiation oncology department placed a transrectal ultrasound probe anchoring stand/ grid and aligned with previous imaging from the volume study. Foley catheter was inserted without difficulty.  All needle passage was done with real-time transrectal ultrasound guidance in both the transverse and sagittal plains in order to achieve the desired preplanned position. A total of 24 needles were placed.  93 active seeds were implanted. The Foley catheter was removed and a rigid cystoscopy failed to show any seeds outside the prostate without evidence of trauma to the urethral, prostatic fossa, or bladder.  Notably, the patient had an elevated bladder neck as well as an incidental 8 mm bladder stone.  With some manipulation, I ultimately was able to evacuate this from the bladder after crushing it with forceps.  The bladder was drained.  A fluoroscopic image was then obtained showing excellent distrubution of the brachytherapy seeds.  Each seed was counted and counts were correct.    The patient was then repositioned in the supine position, reversed from anesthesia, and taken to the PACU in stable condition. \ He will follow-up with me in a month for IPSS/PVR

## 2020-07-02 NOTE — Transfer of Care (Signed)
Immediate Anesthesia Transfer of Care Note  Patient: Ray Saunders  Procedure(s) Performed: RADIOACTIVE SEED IMPLANT/BRACHYTHERAPY IMPLANT (N/A )  Patient Location: PACU  Anesthesia Type:General  Level of Consciousness: drowsy and patient cooperative  Airway & Oxygen Therapy: Patient Spontanous Breathing  Post-op Assessment: Report given to RN and Post -op Vital signs reviewed and stable  Post vital signs: Reviewed and stable  Last Vitals:  Vitals Value Taken Time  BP 133/78 07/02/20 0852  Temp 36.2 C 07/02/20 0852  Pulse 84 07/02/20 0856  Resp 16 07/02/20 0856  SpO2 100 % 07/02/20 0856    Last Pain:  Vitals:   07/02/20 0617  TempSrc: Tympanic  PainSc: 0-No pain         Complications: No complications documented.

## 2020-07-02 NOTE — Anesthesia Procedure Notes (Signed)
Procedure Name: Intubation Date/Time: 07/02/2020 7:57 AM Performed by: Debe Coder, CRNA Pre-anesthesia Checklist: Patient identified, Emergency Drugs available, Suction available and Patient being monitored Patient Re-evaluated:Patient Re-evaluated prior to induction Oxygen Delivery Method: Circle system utilized Preoxygenation: Pre-oxygenation with 100% oxygen Induction Type: IV induction Ventilation: Oral airway inserted - appropriate to patient size Laryngoscope Size: McGraph and 4 Grade View: Grade II Tube type: Oral Tube size: 7.0 mm Number of attempts: 1 Airway Equipment and Method: Stylet and Oral airway Placement Confirmation: ETT inserted through vocal cords under direct vision,  positive ETCO2 and breath sounds checked- equal and bilateral Secured at: 23.5 cm Tube secured with: Tape Dental Injury: Teeth and Oropharynx as per pre-operative assessment

## 2020-07-02 NOTE — Anesthesia Preprocedure Evaluation (Addendum)
Anesthesia Evaluation  Patient identified by MRN, date of birth, ID band Patient awake    Reviewed: Allergy & Precautions, NPO status , Patient's Chart, lab work & pertinent test results  Airway Mallampati: II       Dental  (+) Partial Upper   Pulmonary neg pulmonary ROS, former smoker,    Pulmonary exam normal        Cardiovascular hypertension, Pt. on medications Normal cardiovascular exam     Neuro/Psych    GI/Hepatic Neg liver ROS, GERD  ,  Endo/Other  negative endocrine ROS  Renal/GU negative Renal ROS     Musculoskeletal negative musculoskeletal ROS (+)   Abdominal (+) + obese,   Peds negative pediatric ROS (+)  Hematology negative hematology ROS (+)   Anesthesia Other Findings   Reproductive/Obstetrics negative OB ROS                             Anesthesia Physical  Anesthesia Plan  ASA: III  Anesthesia Plan: General   Post-op Pain Management:    Induction: Intravenous  PONV Risk Score and Plan:   Airway Management Planned: Oral ETT  Additional Equipment:   Intra-op Plan:   Post-operative Plan: Extubation in OR  Informed Consent: I have reviewed the patients History and Physical, chart, labs and discussed the procedure including the risks, benefits and alternatives for the proposed anesthesia with the patient or authorized representative who has indicated his/her understanding and acceptance.       Plan Discussed with: CRNA  Anesthesia Plan Comments:        Anesthesia Quick Evaluation

## 2020-07-02 NOTE — Interval H&P Note (Signed)
History and Physical Interval Note:  07/02/2020 7:22 AM  Ray Saunders  has presented today for surgery, with the diagnosis of prostate cancer.  The various methods of treatment have been discussed with the patient and family. After consideration of risks, benefits and other options for treatment, the patient has consented to  Procedure(s): RADIOACTIVE SEED IMPLANT/BRACHYTHERAPY IMPLANT (N/A) as a surgical intervention.  The patient's history has been reviewed, patient examined, no change in status, stable for surgery.  I have reviewed the patient's chart and labs.  Questions were answered to the patient's satisfaction.    RRR CTAB   Hollice Espy

## 2020-07-02 NOTE — Anesthesia Postprocedure Evaluation (Signed)
Anesthesia Post Note  Patient: Ray Saunders  Procedure(s) Performed: RADIOACTIVE SEED IMPLANT/BRACHYTHERAPY IMPLANT (N/A )  Patient location during evaluation: PACU Anesthesia Type: General Level of consciousness: awake and alert and oriented Pain management: pain level controlled Vital Signs Assessment: post-procedure vital signs reviewed and stable Respiratory status: spontaneous breathing Cardiovascular status: blood pressure returned to baseline Anesthetic complications: no   No complications documented.   Last Vitals:  Vitals:   07/02/20 0929 07/02/20 1045  BP: 139/82 (!) 156/88  Pulse: 63 60  Resp: 18 18  Temp: (!) 36.1 C   SpO2:  100%    Last Pain:  Vitals:   07/02/20 1045  TempSrc:   PainSc: 0-No pain                 Shaneya Taketa

## 2020-07-03 ENCOUNTER — Encounter: Payer: Self-pay | Admitting: Urology

## 2020-07-24 ENCOUNTER — Other Ambulatory Visit: Payer: Self-pay | Admitting: Urology

## 2020-07-30 ENCOUNTER — Other Ambulatory Visit: Payer: Self-pay

## 2020-07-30 ENCOUNTER — Encounter: Payer: Self-pay | Admitting: Radiation Oncology

## 2020-07-30 ENCOUNTER — Ambulatory Visit: Payer: BC Managed Care – PPO | Attending: Radiation Oncology

## 2020-07-30 ENCOUNTER — Ambulatory Visit
Admission: RE | Admit: 2020-07-30 | Discharge: 2020-07-30 | Disposition: A | Discharge status: Home / Self Care | Payer: BC Managed Care – PPO | Source: Ambulatory Visit | Attending: Radiation Oncology | Admitting: Radiation Oncology

## 2020-07-30 VITALS — BP 143/78 | HR 75 | Temp 97.6°F | Wt 223.0 lb

## 2020-07-30 DIAGNOSIS — C61 Malignant neoplasm of prostate: Secondary | ICD-10-CM | POA: Insufficient documentation

## 2020-07-30 DIAGNOSIS — Z510 Encounter for antineoplastic radiation therapy: Secondary | ICD-10-CM | POA: Insufficient documentation

## 2020-07-30 DIAGNOSIS — Z923 Personal history of irradiation: Secondary | ICD-10-CM | POA: Diagnosis not present

## 2020-07-30 NOTE — Progress Notes (Signed)
Radiation Oncology Follow up Note  Name: Ray Saunders   Date:   07/30/2020 MRN:  [id number] DOB: 1960/04/03    This 60 y.o. 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  REFERRING PROVIDER: Sofie Hartigan, MD  HPI: Patient is a 60 year old male now out 1 month having completed I-125 interstitial implant for Gleason 6 adenocarcinoma.  Seen today in routine follow-up he is doing well.  He had several episodes early on of some difficulty with his stream although that has resolved he is having no diarrhea no fatigue.  He had a CT scan for quality assurance today showing excellent source placement..  COMPLICATIONS OF TREATMENT: none  FOLLOW UP COMPLIANCE: keeps appointments   PHYSICAL EXAM:  BP (!) 143/78   Pulse 75   Temp 97.6 F (36.4 C) (Tympanic)   Wt 223 lb (101.2 kg)   BMI 35.99 kg/m  Well-developed well-nourished patient in NAD. HEENT reveals PERLA, EOMI, discs not visualized.  Oral cavity is clear. No oral mucosal lesions are identified. Neck is clear without evidence of cervical or supraclavicular adenopathy. Lungs are clear to A&P. Cardiac examination is essentially unremarkable with regular rate and rhythm without murmur rub or thrill. Abdomen is benign with no organomegaly or masses noted. Motor sensory and DTR levels are equal and symmetric in the upper and lower extremities. Cranial nerves II through XII are grossly intact. Proprioception is intact. No peripheral adenopathy or edema is identified. No motor or sensory levels are noted. Crude visual fields are within normal range.  RADIOLOGY RESULTS: CT scan for QA of seed placement reviewed compatible with above-stated findings showing excellent source placement  PLAN: Present time patient is doing well with low side effect profile.  He is currently still radioactive with his seeds.  Of asked to see him back in 3 months  with a PSA prior to that visit.  Patient knows to call sooner with any concerns.  I would like to take this opportunity to thank you for allowing me to participate in the care of your patient.Noreene Filbert, MD

## 2020-08-01 DIAGNOSIS — C61 Malignant neoplasm of prostate: Secondary | ICD-10-CM | POA: Diagnosis present

## 2020-08-01 DIAGNOSIS — Z510 Encounter for antineoplastic radiation therapy: Secondary | ICD-10-CM | POA: Diagnosis not present

## 2020-08-02 DIAGNOSIS — C61 Malignant neoplasm of prostate: Secondary | ICD-10-CM | POA: Diagnosis not present

## 2020-08-03 ENCOUNTER — Other Ambulatory Visit: Payer: Self-pay | Admitting: *Deleted

## 2020-08-03 DIAGNOSIS — C61 Malignant neoplasm of prostate: Secondary | ICD-10-CM

## 2020-08-14 ENCOUNTER — Ambulatory Visit: Payer: Self-pay | Admitting: Urology

## 2020-08-16 ENCOUNTER — Other Ambulatory Visit: Payer: Self-pay | Admitting: Urology

## 2020-08-28 ENCOUNTER — Encounter: Payer: Self-pay | Admitting: Urology

## 2020-08-28 ENCOUNTER — Other Ambulatory Visit: Payer: Self-pay

## 2020-08-28 ENCOUNTER — Ambulatory Visit (INDEPENDENT_AMBULATORY_CARE_PROVIDER_SITE_OTHER): Payer: BC Managed Care – PPO | Admitting: Urology

## 2020-08-28 VITALS — BP 147/89 | HR 82 | Ht 66.0 in | Wt 223.0 lb

## 2020-08-28 DIAGNOSIS — N5236 Erectile dysfunction following interstitial seed therapy: Secondary | ICD-10-CM | POA: Diagnosis not present

## 2020-08-28 DIAGNOSIS — R3912 Poor urinary stream: Secondary | ICD-10-CM | POA: Diagnosis not present

## 2020-08-28 DIAGNOSIS — C61 Malignant neoplasm of prostate: Secondary | ICD-10-CM

## 2020-08-28 LAB — BLADDER SCAN AMB NON-IMAGING: Scan Result: 190

## 2020-08-28 MED ORDER — TAMSULOSIN HCL 0.4 MG PO CAPS: 0.4000 mg | ORAL_CAPSULE | Freq: Every day | ORAL | 11 refills | Status: DC

## 2020-08-28 MED ORDER — SILDENAFIL CITRATE 20 MG PO TABS: 20.0000 mg | ORAL_TABLET | Freq: Three times a day (TID) | ORAL | 11 refills | Status: DC

## 2020-08-28 NOTE — Progress Notes (Signed)
08/28/2020 3:56 PM   Ray Saunders 20-Nov-1960 [id number]  Referring provider: Sofie Hartigan, MD [place],  [place] [address]  Chief Complaint  Patient presents with   Prostate Cancer    HPI: 60 year old male with a personal history of prostate cancer, intermediate risk with a single core of Gleason 3+4 who ultimately elected to undergo placement of brachytherapy at high 125 seeds which was completed in 06/2020.  He did not receive IMRT.  He returns today for follow-up of this.  He is not had a PSA since he placement.  IPSS today as below.  PVR today is mildly elevated as outlined below.  He reports that he had a lot of trouble with urination since the procedure although this is improving.  He did have some burning urgency frequency initially but this is resolved.  His primary symptoms now include straining and difficulty emptying.  When he does not take his Flomax, he is worse symptoms.  Some days he does totally fine and other days he is in a lot of discomfort.  He also mentions today that he has had some mild erectile dysfunction which was normal preop.  He is having some difficulty achieving and maintaining his erections.  He reports that that is why he chose seed implantation due to his concern for erection issues.  He is never tried PDE 5 inhibitors.    IPSS     Row Name 08/28/20 1500         International Prostate Symptom Score   How often have you had the sensation of not emptying your bladder? Less than half the time     How often have you had to urinate less than every two hours? Less than half the time     How often have you found you stopped and started again several times when you urinated? About half the time     How often have you found it difficult to postpone urination? Not at All     How often have you had a weak urinary stream? More than half the time     How often have you had to strain to start urination? About half the time     How many  times did you typically get up at night to urinate? 2 Times     Total IPSS Score 16           Quality of Life due to urinary symptoms     If you were to spend the rest of your life with your urinary condition just the way it is now how would you feel about that? Mixed             Score:  1-7 Mild 8-19 Moderate 20-35 Severe   PMH: Past Medical History:  Diagnosis Date   Alcohol abuse    Cancer ([place])    PROSTATE   Elevated PSA    Elevated PSA    GERD (gastroesophageal reflux disease)    History of kidney stones    HLD (hyperlipidemia)    Hypertension     Surgical History: Past Surgical History:  Procedure Laterality Date   COLONOSCOPY WITH PROPOFOL N/A 10/09/2014   Procedure: COLONOSCOPY WITH PROPOFOL;  Surgeon: Lollie Sails, MD;  Location: [facility] ENDOSCOPY;  Service: Endoscopy;  Laterality: N/A;   radiation   05/2020   prostate   RADIOACTIVE SEED IMPLANT N/A 07/02/2020   Procedure: RADIOACTIVE SEED IMPLANT/BRACHYTHERAPY IMPLANT;  Surgeon: Hollice Espy, MD;  Location:  [facility] ORS;  Service: Urology;  Laterality: N/A;  removal of bladder stone    Home Medications:  Allergies as of 08/28/2020   No Known Allergies      Medication List        Accurate as of August 28, 2020  3:56 PM. If you have any questions, ask your nurse or doctor.          STOP taking these medications    HYDROcodone-acetaminophen 5-325 MG tablet Commonly known as: NORCO/VICODIN Stopped by: Hollice Espy, MD   naproxen 500 MG tablet Commonly known as: Naprosyn Stopped by: Hollice Espy, MD   sulfamethoxazole-trimethoprim 800-160 MG tablet Commonly known as: BACTRIM DS Stopped by: Hollice Espy, MD       TAKE these medications    atorvastatin 10 MG tablet Commonly known as: LIPITOR Take 10 mg by mouth daily.   lisinopril-hydrochlorothiazide 20-12.5 MG tablet Commonly known as: ZESTORETIC TAKE ONE (1) TABLET EACH DAY   tamsulosin 0.4 MG Caps capsule Commonly known as:  FLOMAX TAKE 1 CAPSULE BY MOUTH EVERY DAY        Allergies: No Known Allergies  Family History: Family History  Problem Relation Age of Onset   Kidney failure Mother        transplant   Prostate cancer Maternal Grandfather    Bladder Cancer Neg Hx     Social History:  reports that he quit smoking about 33 years ago. His smoking use included cigarettes. He has never used smokeless tobacco. He reports that he does not drink alcohol and does not use drugs.   Physical Exam: BP (!) 147/89   Pulse 82   Ht 5\' 6"  (1.676 m)   Wt 223 lb (101.2 kg)   BMI 35.99 kg/m   Constitutional:  Alert and oriented, No acute distress. HEENT: [place] AT, moist mucus membranes.  Trachea midline, no masses. Cardiovascular: No clubbing, cyanosis, or edema. Respiratory: Normal respiratory effort, no increased work of breathing. Skin: No rashes, bruises or suspicious lesions. Neurologic: Grossly intact, no focal deficits, moving all 4 extremities. Psychiatric: Normal mood and affect.  Laboratory Data: Lab Results  Component Value Date   WBC 6.7 06/28/2020   HGB 17.4 (H) 06/28/2020   HCT 48.8 06/28/2020   MCV 86.7 06/28/2020   PLT 289 06/28/2020    Lab Results  Component Value Date   CREATININE 1.23 06/28/2020    Pertinent Imaging: Results for orders placed or performed in visit on 08/28/20  Bladder Scan (Post Void Residual) in office  Result Value Ref Range   Scan Result 190      Assessment & Plan:    1. Prostate cancer ([place]) S/p I-125 seed implantation  We will follow-up his next PSA which has been ordered by Dr. Baruch Gouty, declined ADT - Bladder Scan (Post Void Residual) in office  2. Weak urinary stream Worsening obstructive urinary symptoms, will manage conservatively with Flomax but consider additional treatment/therapies if his symptoms fail to improve with time  Mildly elevated PVR today, will continue to monitor  Warning symptoms reviewed and indications for more  urgent/emergent reevaluation discussed  3. Erectile dysfunction following interstitial seed therapy Lengthy discussion today about new onset erectile dysfunction including management options including PDE 5 inhibitors  He would like to try generic sildenafil, discussed increasing dose, possible side effects.  A prescription was sent to [facility] and he was provided with a good Rx coupon.  F/u 6 months with IPSS/ PVR (PSA will be done prior by Dr. Baruch Gouty)  Caryl Pina  Erlene Quan, [place] [address] [place], [place] [address] [phone]

## 2020-10-25 ENCOUNTER — Inpatient Hospital Stay: Payer: BC Managed Care – PPO | Attending: Radiation Oncology

## 2020-10-25 ENCOUNTER — Other Ambulatory Visit: Payer: Self-pay

## 2020-10-25 DIAGNOSIS — C61 Malignant neoplasm of prostate: Secondary | ICD-10-CM | POA: Diagnosis not present

## 2020-10-25 LAB — PSA: Prostatic Specific Antigen: 1.84 ng/mL (ref 0.00–4.00)

## 2020-10-30 ENCOUNTER — Ambulatory Visit: Payer: BC Managed Care – PPO | Admitting: Radiation Oncology

## 2020-11-06 ENCOUNTER — Ambulatory Visit: Payer: BC Managed Care – PPO | Admitting: Radiation Oncology

## 2020-11-07 ENCOUNTER — Ambulatory Visit
Admission: RE | Admit: 2020-11-07 | Discharge: 2020-11-07 | Disposition: A | Discharge status: Home / Self Care | Payer: BC Managed Care – PPO | Source: Ambulatory Visit | Attending: Radiation Oncology | Admitting: Radiation Oncology

## 2020-11-07 ENCOUNTER — Other Ambulatory Visit: Payer: Self-pay

## 2020-11-07 ENCOUNTER — Other Ambulatory Visit: Payer: Self-pay | Admitting: *Deleted

## 2020-11-07 VITALS — BP 132/89 | HR 64 | Temp 98.5°F | Resp 20 | Wt 223.0 lb

## 2020-11-07 DIAGNOSIS — Z923 Personal history of irradiation: Secondary | ICD-10-CM | POA: Diagnosis not present

## 2020-11-07 DIAGNOSIS — N529 Male erectile dysfunction, unspecified: Secondary | ICD-10-CM | POA: Diagnosis not present

## 2020-11-07 DIAGNOSIS — C61 Malignant neoplasm of prostate: Secondary | ICD-10-CM

## 2020-11-07 NOTE — Progress Notes (Signed)
Radiation Oncology Follow up Note  Name: Ray Saunders   Date:   11/07/2020 MRN:  [id number] DOB: [birth year]    This 60 y.o. male presents to the clinic today for 38-monthfollow-up status post I-125 interstitial implant for Gleason 6 adenocarcinoma the prostate.  REFERRING PROVIDER: FSofie Hartigan MD  HPI: Patient is a 60year old male now out 4 months having completed I-125 interstitial implant for Gleason 6 adenocarcinoma seen today in routine follow-up he is doing fairly well.  He takes Flomax but in the morning I suggested switching that to night.  He does have.  Some urgency and frequency during the day.  He is having no GI problems.  Patient did decline ADT therapy.  He is also on genericsildenafil, for erectile dysfunction.  His PSA is now 1.8 down from 3.2 a year ago.  COMPLICATIONS OF TREATMENT: none  FOLLOW UP COMPLIANCE: keeps appointments   PHYSICAL EXAM:  BP 132/89   Pulse 64   Temp 98.5 F (36.9 C) (Tympanic)   Resp 20   Wt 223 lb (101.2 kg)   BMI 35.99 kg/m  Well-developed well-nourished patient in NAD. HEENT reveals PERLA, EOMI, discs not visualized.  Oral cavity is clear. No oral mucosal lesions are identified. Neck is clear without evidence of cervical or supraclavicular adenopathy. Lungs are clear to A&P. Cardiac examination is essentially unremarkable with regular rate and rhythm without murmur rub or thrill. Abdomen is benign with no organomegaly or masses noted. Motor sensory and DTR levels are equal and symmetric in the upper and lower extremities. Cranial nerves II through XII are grossly intact. Proprioception is intact. No peripheral adenopathy or edema is identified. No motor or sensory levels are noted. Crude visual fields are within normal range.  RADIOLOGY RESULTS: No current films to review  PLAN: Present time patient is doing well he does have some lower urinary tract symptoms being managed by urology.  I have suggested he switch his his Flomax to  half an hour after his evening meal.  Patient knows to call with any concerns.  I will see him back in 6 months with a repeat PSA at that time.  I would like to take this opportunity to thank you for allowing me to participate in the care of your patient..Noreene Filbert MD

## 2021-03-04 NOTE — Progress Notes (Signed)
03/05/21 8:41 AM   Ray Saunders [birth year] [id number]  Referring provider:  Sofie Hartigan, MD [place],  [place] [address] Chief Complaint  Patient presents with   Prostate Cancer     HPI: Ray Saunders is a 61 y.o.male with a personal history of prostate cancer, weak urinary stream, and erectile dysfunction , who presents today for 1 year follow-up with IPSS and PVR.   He underwent a prostate biopsy in 08/2017 for abnormal PSA. Prostate volume 35 g. Past 1/12 cores positive Gleason 3+3 adenocarcinoma (left lateral mid, 4%).  MRI prior to confirmatory biopsy 46 g prostate; PI-RADS 3 lesion R PZ. MR fusion biopsy 08/27/2019 2/4 ROI biopsies positive Gleason 3+3 adenocarcinoma; 4/12 biopsies positive for Gleason 3+3 adenocarcinoma with tissue involvement ranging 10-90%; 1/12 core positive Gleason 3+4 adenocarcinoma (30%)  He ultimately elected to undergo placement of brachytherapy at I-125 seeds which was completed in 06/2020. He did not receive ADT.   His most recent PSA on 10/25/2020 was 1.84.   He reports today that he has not had any liquid intake today which he thinks is causing is incomplete bladder emptying. His urinary symptoms are waxing and waning he notices changes in his urinary stream throughout the week. He reports that he primarily drinks diet Pepsi. He is still taking Flomax during the nighttime. He has taken sildenafil a few times but he feels as if it does not work very well.    IPSS     Row Name 03/05/21 0800         International Prostate Symptom Score   How often have you had the sensation of not emptying your bladder? Less than half the time     How often have you had to urinate less than every two hours? Less than half the time     How often have you found you stopped and started again several times when you urinated? Less than half the time     How often have you found it difficult to postpone urination? Not at All     How often have  you had a weak urinary stream? More than half the time     How often have you had to strain to start urination? Less than half the time     How many times did you typically get up at night to urinate? 1 Time     Total IPSS Score 13       Quality of Life due to urinary symptoms   If you were to spend the rest of your life with your urinary condition just the way it is now how would you feel about that? Pleased              Score:  1-7 Mild 8-19 Moderate 20-35 Severe   PMH: Past Medical History:  Diagnosis Date   Alcohol abuse    Cancer ([place])    PROSTATE   Elevated PSA    Elevated PSA    GERD (gastroesophageal reflux disease)    History of kidney stones    HLD (hyperlipidemia)    Hypertension     Surgical History: Past Surgical History:  Procedure Laterality Date   COLONOSCOPY WITH PROPOFOL N/A 10/09/2014   Procedure: COLONOSCOPY WITH PROPOFOL;  Surgeon: Lollie Sails, MD;  Location: [facility] ENDOSCOPY;  Service: Endoscopy;  Laterality: N/A;   radiation   05/2020   prostate   RADIOACTIVE SEED IMPLANT N/A 07/02/2020   Procedure: RADIOACTIVE SEED  IMPLANT/BRACHYTHERAPY IMPLANT;  Surgeon: Hollice Espy, MD;  Location: [facility] ORS;  Service: Urology;  Laterality: N/A;  removal of bladder stone    Home Medications:  Allergies as of 03/05/2021   No Known Allergies      Medication List        Accurate as of March 05, 2021  8:41 AM. If you have any questions, ask your nurse or doctor.          STOP taking these medications    atorvastatin 10 MG tablet Commonly known as: LIPITOR Stopped by: Hollice Espy, MD   sildenafil 20 MG tablet Commonly known as: REVATIO Stopped by: Hollice Espy, MD       TAKE these medications    amoxicillin-clavulanate 875-125 MG tablet Commonly known as: AUGMENTIN Take 1 tablet by mouth 2 (two) times daily.   fluticasone 50 MCG/ACT nasal spray Commonly known as: FLONASE Place into both nostrils.    lisinopril-hydrochlorothiazide 20-12.5 MG tablet Commonly known as: ZESTORETIC TAKE ONE (1) TABLET EACH DAY   promethazine-dextromethorphan 6.25-15 MG/5ML syrup Commonly known as: PROMETHAZINE-DM Take by mouth.   tamsulosin 0.4 MG Caps capsule Commonly known as: FLOMAX Take 1 capsule (0.4 mg total) by mouth daily.        Allergies: No Known Allergies  Family History: Family History  Problem Relation Age of Onset   Kidney failure Mother        transplant   Prostate cancer Maternal Grandfather    Bladder Cancer Neg Hx     Social History:  reports that he quit smoking about 33 years ago. His smoking use included cigarettes. He has never used smokeless tobacco. He reports that he does not drink alcohol and does not use drugs.   Physical Exam: BP (!) 153/100    Pulse 82    Ht 5\' 6"  (1.676 m)    Wt 220 lb (99.8 kg)    BMI 35.51 kg/m   Constitutional:  Alert and oriented, No acute distress. HEENT: [place] AT, moist mucus membranes.  Trachea midline, no masses. Cardiovascular: No clubbing, cyanosis, or edema. Respiratory: Normal respiratory effort, no increased work of breathing. Skin: No rashes, bruises or suspicious lesions. Neurologic: Grossly intact, no focal deficits, moving all 4 extremities. Psychiatric: Normal mood and affect.  Laboratory Data: Lab Results  Component Value Date   CREATININE 1.23 06/28/2020    Pertinent Imaging: Results for orders placed or performed in visit on 03/05/21  BLADDER SCAN AMB NON-IMAGING  Result Value Ref Range   Scan Result 254 ml      Assessment & Plan:    Prostate cancer  - S/p I-125 seed implantation  - PSA stable recommend PSA every 6 months  - Return in March for PSA only then 9/23 with PSA / MD visit -NED, establishing PSA nadir  2. BPH with incomplete bladder emptying - Bladder Scan (Post Void Residual) in office - He is not emptying adequately today but could empty more (likely not true PVR); will continue to  monitor - Discussed behavioral management that includes cutting back on how much diet Pepsi he drinks.  - continue Flomax   3. Erectile dysfunction - Not bothersome symptoms  - Continue on sildenafil, discussed optimal use to improve efficacy  Return in March 2023 for PSA and in August 2023 for follow-up with PSA prior  I,Kailey Littlejohn,acting as a scribe for Hollice Espy, MD.,have documented all relevant documentation on the behalf of Hollice Espy, MD,as directed by  Hollice Espy, MD while in the  presence of Hollice Espy, MD.  I have reviewed the above documentation for accuracy and completeness, and I agree with the above.   Hollice Espy, MD   [facility] [address], [place], [place] [address] [phone]

## 2021-03-05 ENCOUNTER — Other Ambulatory Visit: Payer: Self-pay

## 2021-03-05 ENCOUNTER — Ambulatory Visit: Payer: BC Managed Care – PPO | Admitting: Urology

## 2021-03-05 VITALS — BP 153/100 | HR 82 | Ht 66.0 in | Wt 220.0 lb

## 2021-03-05 DIAGNOSIS — R3912 Poor urinary stream: Secondary | ICD-10-CM | POA: Diagnosis not present

## 2021-03-05 LAB — BLADDER SCAN AMB NON-IMAGING: Scan Result: 254

## 2021-05-02 ENCOUNTER — Ambulatory Visit: Payer: BC Managed Care – PPO

## 2021-05-02 ENCOUNTER — Other Ambulatory Visit: Payer: BC Managed Care – PPO

## 2021-05-02 ENCOUNTER — Other Ambulatory Visit: Payer: Self-pay

## 2021-05-02 DIAGNOSIS — C61 Malignant neoplasm of prostate: Secondary | ICD-10-CM

## 2021-05-03 ENCOUNTER — Telehealth: Payer: Self-pay

## 2021-05-03 LAB — PSA: Prostate Specific Ag, Serum: 1.3 ng/mL (ref 0.0–4.0)

## 2021-05-03 NOTE — Telephone Encounter (Signed)
-----   Message from Hollice Espy, MD sent at 05/03/2021  2:50 PM EST ----- ?PSA trending downward appropriately, 1.3.  See you in 6 months.   ? ?Hollice Espy, MD ? ?

## 2021-05-03 NOTE — Telephone Encounter (Signed)
Left detailed message.   

## 2021-05-08 ENCOUNTER — Inpatient Hospital Stay: Payer: BC Managed Care – PPO | Attending: Radiation Oncology

## 2021-05-08 ENCOUNTER — Other Ambulatory Visit: Payer: Self-pay

## 2021-05-08 DIAGNOSIS — C61 Malignant neoplasm of prostate: Secondary | ICD-10-CM | POA: Diagnosis present

## 2021-05-08 LAB — PSA: Prostatic Specific Antigen: 1.2 ng/mL (ref 0.00–4.00)

## 2021-05-15 ENCOUNTER — Other Ambulatory Visit: Payer: Self-pay

## 2021-05-15 ENCOUNTER — Ambulatory Visit
Admission: RE | Admit: 2021-05-15 | Discharge: 2021-05-15 | Disposition: A | Discharge status: Home / Self Care | Payer: BC Managed Care – PPO | Source: Ambulatory Visit | Attending: Radiation Oncology | Admitting: Radiation Oncology

## 2021-05-15 ENCOUNTER — Encounter: Payer: Self-pay | Admitting: Radiation Oncology

## 2021-05-15 VITALS — BP 130/78 | HR 79 | Temp 96.6°F | Resp 18 | Ht 66.0 in | Wt 229.0 lb

## 2021-05-15 DIAGNOSIS — C61 Malignant neoplasm of prostate: Secondary | ICD-10-CM | POA: Insufficient documentation

## 2021-05-15 DIAGNOSIS — Z923 Personal history of irradiation: Secondary | ICD-10-CM | POA: Diagnosis not present

## 2021-05-15 NOTE — Progress Notes (Signed)
Radiation Oncology ?Follow up Note ? ?Name: Ray Saunders   ?Date:   05/15/2021 ?MRN:  [id number] ?DOB: 12-30-1960  ? ? ?This 61 y.o. male presents to the clinic today for 9-monthfollow-up status post I-125 interstitial implant for Gleason 6 adenocarcinoma the prostate. ? ?REFERRING PROVIDER: FSofie Hartigan MD ? ?HPI: Patient is a 61year old male now out 10 months having a pleated I-125 interstitial implant for Gleason 6 adenocarcinoma the prostate.  Seen today in routine follow-up he is doing well specifically denies any increased lower urinary tract symptoms diarrhea or fatigue.  His most recent PSA is.  1.2 down from 1.846 months ago ? ?COMPLICATIONS OF TREATMENT: none ? ?FOLLOW UP COMPLIANCE: keeps appointments  ? ?PHYSICAL EXAM:  ?BP 130/78   Pulse 79   Temp (!) 96.6 ?F (35.9 ?C)   Resp 18   Ht '5\' 6"'$  (1.676 m)   Wt 229 lb (103.9 kg)   BMI 36.96 kg/m?  ?Well-developed well-nourished patient in NAD. HEENT reveals PERLA, EOMI, discs not visualized.  Oral cavity is clear. No oral mucosal lesions are identified. Neck is clear without evidence of cervical or supraclavicular adenopathy. Lungs are clear to A&P. Cardiac examination is essentially unremarkable with regular rate and rhythm without murmur rub or thrill. Abdomen is benign with no organomegaly or masses noted. Motor sensory and DTR levels are equal and symmetric in the upper and lower extremities. Cranial nerves II through XII are grossly intact. Proprioception is intact. No peripheral adenopathy or edema is identified. No motor or sensory levels are noted. Crude visual fields are within normal range. ? ?RADIOLOGY RESULTS: No current films for review ? ?PLAN: Present time patient is doing well under excellent biochemical control of his prostate cancer.  I have asked to see him back in 1 year for follow-up.  He continues follow-up care with urology.  Patient knows to call with any concerns. ? ?I would like to take this opportunity to thank you  for allowing me to participate in the care of your patient.. ?  ? GNoreene Filbert MD ? ?

## 2021-08-28 ENCOUNTER — Ambulatory Visit: Payer: Self-pay | Admitting: Urology

## 2021-09-07 ENCOUNTER — Other Ambulatory Visit: Payer: Self-pay | Admitting: Urology

## 2021-10-03 ENCOUNTER — Other Ambulatory Visit: Payer: BC Managed Care – PPO

## 2021-10-03 DIAGNOSIS — C61 Malignant neoplasm of prostate: Secondary | ICD-10-CM

## 2021-10-04 LAB — PSA: Prostate Specific Ag, Serum: 1 ng/mL (ref 0.0–4.0)

## 2021-10-08 ENCOUNTER — Encounter: Payer: Self-pay | Admitting: Urology

## 2021-10-08 ENCOUNTER — Ambulatory Visit: Payer: BC Managed Care – PPO | Admitting: Urology

## 2021-10-08 VITALS — BP 182/98 | HR 66 | Ht 66.0 in | Wt 229.0 lb

## 2021-10-08 DIAGNOSIS — R339 Retention of urine, unspecified: Secondary | ICD-10-CM | POA: Diagnosis not present

## 2021-10-08 DIAGNOSIS — N401 Enlarged prostate with lower urinary tract symptoms: Secondary | ICD-10-CM | POA: Diagnosis not present

## 2021-10-08 DIAGNOSIS — N5236 Erectile dysfunction following interstitial seed therapy: Secondary | ICD-10-CM | POA: Diagnosis not present

## 2021-10-08 DIAGNOSIS — C61 Malignant neoplasm of prostate: Secondary | ICD-10-CM

## 2021-10-08 NOTE — Progress Notes (Signed)
10/08/21 10:55 AM   [name] June 01, 1960 [id number]  Referring provider:  Sofie Hartigan, MD [place],  [place] [address] Chief Complaint  Patient presents with   Prostate Cancer     HPI: Ray Saunders is a 61 y.o.male with a personal history of prostate cancer, weak urinary stream, and erectile dysfunction , who presents today for a follow-up on PSA.   He underwent a prostate biopsy in 08/2017 for abnormal PSA. Prostate volume 35 g. Past 1/12 cores positive Gleason 3+3 adenocarcinoma (left lateral mid, 4%).   MRI prior to confirmatory biopsy 46 g prostate; PI-RADS 3 lesion R PZ. MR fusion biopsy 08/27/2019 2/4 ROI biopsies positive Gleason 3+3 adenocarcinoma; 4/12 biopsies positive for Gleason 3+3 adenocarcinoma with tissue involvement ranging 10-90%; 1/12 core positive Gleason 3+4 adenocarcinoma (30%)   He ultimately elected to undergo placement of brachytherapy at I-125 seeds which was completed in 06/2020. He did not receive ADT.    His most recent PSA on 10/03/2021 was 1.0   He is doing well today with no new urinary symptoms. He has some mild ED uses sildenafil as needed with ok results. Patient denies any modifying or aggravating factors.  Patient denies any gross hematuria, dysuria or suprapubic/flank pain.  Patient denies any fevers, chills, nausea or vomiting.   PMH: Past Medical History:  Diagnosis Date   Alcohol abuse    Cancer ([place])    PROSTATE   Elevated PSA    Elevated PSA    GERD (gastroesophageal reflux disease)    History of kidney stones    HLD (hyperlipidemia)    Hypertension     Surgical History: Past Surgical History:  Procedure Laterality Date   COLONOSCOPY WITH PROPOFOL N/A 10/09/2014   Procedure: COLONOSCOPY WITH PROPOFOL;  Surgeon: Lollie Sails, MD;  Location: [facility] END[facility]COPY;  Service: Endoscopy;  Laterality: N/A;   radiation   05/2020   prostate   RADIOACTIVE SEED IMPLANT N/A 07/02/2020   Procedure: RADIOACTIVE  SEED IMPLANT/BRACHYTHERAPY IMPLANT;  Surgeon: Hollice Espy, MD;  Location: [facility] ORS;  Service: Urology;  Laterality: N/A;  removal of bladder stone    Home Medications:  Allergies as of 10/08/2021   No Known Allergies      Medication List        Accurate as of October 08, 2021 10:55 AM. If you have any questions, ask your nurse or doctor.          amoxicillin-clavulanate 875-125 MG tablet Commonly known as: AUGMENTIN Take 1 tablet by mouth 2 (two) times daily.   fluticasone 50 MCG/ACT nasal spray Commonly known as: FLONASE Place into both nostrils.   lisinopril-hydrochlorothiazide 20-12.5 MG tablet Commonly known as: ZESTORETIC TAKE ONE (1) TABLET EACH DAY   promethazine-dextromethorphan 6.25-15 MG/5ML syrup Commonly known as: PROMETHAZINE-DM Take by mouth.   tamsulosin 0.4 MG Caps capsule Commonly known as: FLOMAX TAKE 1 CAPSULE BY MOUTH EVERY DAY        Allergies:  No Known Allergies  Family History: Family History  Problem Relation Age of Onset   Kidney failure Mother        transplant   Prostate cancer Maternal Grandfather    Bladder Cancer Neg Hx     Social History:  reports that he quit smoking about 34 years ago. His smoking use included cigarettes. He has never used smokeless tobacco. He reports that he does not drink alcohol and does not use drugs.   Physical Exam: BP (!) 182/98  Pulse 66   Ht '5\' 6"'$  (1.676 m)   Wt 229 lb (103.9 kg)   BMI 36.96 kg/m   Constitutional:  Alert and oriented, No acute distress. HEENT: [place] AT, moist mucus membranes.  Trachea midline, no masses. Cardiovascular: No clubbing, cyanosis, or edema. Respiratory: Normal respiratory effort, no increased work of breathing. Skin: No rashes, bruises or suspicious lesions. Neurologic: Grossly intact, no focal deficits, moving all 4 extremities. Psychiatric: Normal mood and affect.  Laboratory Data:  Lab Results  Component Value Date   CREATININE 1.23 06/28/2020     Assessment & Plan:   Prostate cancer  - S/p I-125 seed implantation  - PSA stable, likely approaching nadir - Discussed that we expect it to continue to trend downward until he gets to a baseline PSA and that will be his new normal. Discussed th concept of nadar  - Will continue to check PSA every 6 months then annually  2. BPH with incomplete bladder emptying - Continue Flomax   3. Erectile dysfunction - Not bothersome symptoms  - Uses sildenafil occasionally still has prescription; Continue on sildenafil  F/u 6 mo PSA only, MD with PSA in 12 mo  I,Fraser Busche,acting as a scribe for Hollice Espy, MD.,have documented all relevant documentation on the behalf of Hollice Espy, MD,as directed by  Hollice Espy, MD while in the presence of Hollice Espy, MD.  I have reviewed the above documentation for accuracy and completeness, and I agree with the above.   Hollice Espy, MD   [facility] [address], [place], [place] [address] [phone]

## 2021-11-20 ENCOUNTER — Ambulatory Visit
Admission: RE | Admit: 2021-11-20 | Discharge: 2021-11-20 | Disposition: A | Discharge status: Home / Self Care | Payer: BC Managed Care – PPO | Source: Ambulatory Visit | Attending: Radiation Oncology | Admitting: Radiation Oncology

## 2021-11-20 ENCOUNTER — Other Ambulatory Visit: Payer: Self-pay | Admitting: *Deleted

## 2021-11-20 ENCOUNTER — Encounter: Payer: Self-pay | Admitting: Radiation Oncology

## 2021-11-20 VITALS — BP 131/87 | HR 61 | Temp 98.3°F | Resp 16 | Ht 66.0 in | Wt 222.0 lb

## 2021-11-20 DIAGNOSIS — Z923 Personal history of irradiation: Secondary | ICD-10-CM | POA: Diagnosis not present

## 2021-11-20 DIAGNOSIS — C61 Malignant neoplasm of prostate: Secondary | ICD-10-CM

## 2021-11-20 NOTE — Progress Notes (Signed)
Radiation Oncology Follow up Note  Name: Ray Saunders   Date:   11/20/2021 MRN:  [id number] DOB: Apr 05, 1960    This 61 y.o. male presents to the clinic today for 44-monthfollow-up status post I-125 interstitial implant for Gleason 6 adenocarcinoma the prostate  REFERRING PROVIDER: FSofie Hartigan MD  HPI: Patient is a 61year old male now out 16 months having completed I-125 interstitial implant for Gleason 6 adenocarcinoma the prostate.  His PSA shows a downward trend now 1.0 down from 1.26 months prior.  He is doing well has very low side effect profile specifically denies any increased lower urinary tract symptoms diarrhea or fatigue..  COMPLICATIONS OF TREATMENT: none  FOLLOW UP COMPLIANCE: keeps appointments   PHYSICAL EXAM:  BP 131/87 (BP Location: Right Arm, Patient Position: Sitting, Cuff Size: Normal)   Pulse 61   Temp 98.3 F (36.8 C) (Tympanic)   Resp 16   Ht '5\' 6"'$  (1.676 m) Comment: Stated Ht  Wt 222 lb (100.7 kg)   BMI 35.83 kg/m  Well-developed well-nourished patient in NAD. HEENT reveals PERLA, EOMI, discs not visualized.  Oral cavity is clear. No oral mucosal lesions are identified. Neck is clear without evidence of cervical or supraclavicular adenopathy. Lungs are clear to A&P. Cardiac examination is essentially unremarkable with regular rate and rhythm without murmur rub or thrill. Abdomen is benign with no organomegaly or masses noted. Motor sensory and DTR levels are equal and symmetric in the upper and lower extremities. Cranial nerves II through XII are grossly intact. Proprioception is intact. No peripheral adenopathy or edema is identified. No motor or sensory levels are noted. Crude visual fields are within normal range.  RADIOLOGY RESULTS: No current films for review  PLAN: Present time patient is doing well under excellent biochemical control of his prostate cancer.  I am pleased with his overall progress.  Of asked to see him back in 6 months for  follow-up with a PSA preceding that visit.  We will start going out 1 year should his PSA be normalized.  Patient comprehends my recommendations well.  I would like to take this opportunity to thank you for allowing me to participate in the care of your patient..Noreene Filbert MD

## 2022-03-08 ENCOUNTER — Other Ambulatory Visit: Payer: Self-pay | Admitting: Urology

## 2022-04-11 ENCOUNTER — Other Ambulatory Visit: Payer: BC Managed Care – PPO

## 2022-04-11 DIAGNOSIS — C61 Malignant neoplasm of prostate: Secondary | ICD-10-CM

## 2022-04-12 LAB — PSA: Prostate Specific Ag, Serum: 1.1 ng/mL (ref 0.0–4.0)

## 2022-05-15 ENCOUNTER — Other Ambulatory Visit: Payer: Self-pay | Admitting: *Deleted

## 2022-05-15 DIAGNOSIS — C61 Malignant neoplasm of prostate: Secondary | ICD-10-CM

## 2022-05-19 ENCOUNTER — Inpatient Hospital Stay: Payer: BC Managed Care – PPO | Attending: Radiation Oncology

## 2022-05-19 DIAGNOSIS — C61 Malignant neoplasm of prostate: Secondary | ICD-10-CM | POA: Diagnosis not present

## 2022-05-19 LAB — PSA: Prostatic Specific Antigen: 1.06 ng/mL (ref 0.00–4.00)

## 2022-05-26 ENCOUNTER — Ambulatory Visit
Admission: RE | Admit: 2022-05-26 | Discharge: 2022-05-26 | Disposition: A | Discharge status: Home / Self Care | Payer: BC Managed Care – PPO | Source: Ambulatory Visit | Attending: Radiation Oncology | Admitting: Radiation Oncology

## 2022-05-26 ENCOUNTER — Encounter: Payer: Self-pay | Admitting: Radiation Oncology

## 2022-05-26 VITALS — BP 154/74 | HR 71 | Temp 95.3°F | Resp 16 | Ht 66.0 in | Wt 223.1 lb

## 2022-05-26 DIAGNOSIS — Z923 Personal history of irradiation: Secondary | ICD-10-CM | POA: Diagnosis not present

## 2022-05-26 DIAGNOSIS — C61 Malignant neoplasm of prostate: Secondary | ICD-10-CM | POA: Diagnosis not present

## 2022-05-26 NOTE — Progress Notes (Signed)
Radiation Oncology Follow up Note  Name: Ray Saunders   Date:   05/26/2022 MRN:  [id number] DOB: February 08, 1961    This 62 y.o. male presents to the clinic today for 6-month follow-up status post I-125 interstitial implant for Gleason 6 adenocarcinoma the prostate.  REFERRING PROVIDER: Sofie Hartigan, MD  HPI: Patient is a 62 year old male now out 22 months having completed I-125 interstitial implant for a Gleason 6 adenocarcinoma the prostate.  Seen today in routine follow-up he is doing well.  PSA continues to be stable or slightly decline down to 1.06 now was 1.1 back in February 2024.  He specifically denies any increased lower urinary tract symptoms diarrhea or fatigue..  COMPLICATIONS OF TREATMENT: none  FOLLOW UP COMPLIANCE: keeps appointments   PHYSICAL EXAM:  BP (!) 154/74 Comment: patient states that it is high whenever he comes to the cancer cenrter  Pulse 71   Temp (!) 95.3 F (35.2 C)   Resp 16   Ht 5\' 6"  (1.676 m)   Wt 223 lb 1.6 oz (101.2 kg)   BMI 36.01 kg/m  Well-developed well-nourished patient in NAD. HEENT reveals PERLA, EOMI, discs not visualized.  Oral cavity is clear. No oral mucosal lesions are identified. Neck is clear without evidence of cervical or supraclavicular adenopathy. Lungs are clear to A&P. Cardiac examination is essentially unremarkable with regular rate and rhythm without murmur rub or thrill. Abdomen is benign with no organomegaly or masses noted. Motor sensory and DTR levels are equal and symmetric in the upper and lower extremities. Cranial nerves II through XII are grossly intact. Proprioception is intact. No peripheral adenopathy or edema is identified. No motor or sensory levels are noted. Crude visual fields are within normal range.  RADIOLOGY RESULTS: No current films for review  PLAN: Present time patient is doing well under excellent biochemical control of his prostate cancer.  And pleased with his overall progress.  I will go out 1  year for follow-up.  He is alternating 62-month appointments with urology.  Patient is to call with any concerns.  I would like to take this opportunity to thank you for allowing me to participate in the care of your patient.Noreene Filbert, MD

## 2022-10-02 ENCOUNTER — Other Ambulatory Visit: Payer: BC Managed Care – PPO

## 2022-10-02 ENCOUNTER — Other Ambulatory Visit: Payer: Self-pay

## 2022-10-02 DIAGNOSIS — C61 Malignant neoplasm of prostate: Secondary | ICD-10-CM

## 2022-10-03 ENCOUNTER — Ambulatory Visit: Payer: BC Managed Care – PPO | Admitting: Urology

## 2022-10-03 ENCOUNTER — Ambulatory Visit: Payer: BC Managed Care – PPO

## 2022-10-08 ENCOUNTER — Encounter: Payer: Self-pay | Admitting: Urology

## 2022-10-08 ENCOUNTER — Ambulatory Visit: Payer: BC Managed Care – PPO | Admitting: Urology

## 2022-10-08 VITALS — BP 131/79 | HR 71 | Wt 220.0 lb

## 2022-10-08 DIAGNOSIS — N401 Enlarged prostate with lower urinary tract symptoms: Secondary | ICD-10-CM

## 2022-10-08 DIAGNOSIS — C61 Malignant neoplasm of prostate: Secondary | ICD-10-CM | POA: Diagnosis not present

## 2022-10-08 DIAGNOSIS — R3912 Poor urinary stream: Secondary | ICD-10-CM | POA: Diagnosis not present

## 2022-10-08 DIAGNOSIS — N5236 Erectile dysfunction following interstitial seed therapy: Secondary | ICD-10-CM | POA: Diagnosis not present

## 2022-10-08 LAB — BLADDER SCAN AMB NON-IMAGING: Scan Result: 53

## 2022-10-08 NOTE — Patient Instructions (Signed)
Stop Flomax, use Cialis up to 20mg , 2 hours prior to having sexual activity.

## 2022-10-08 NOTE — Progress Notes (Signed)
Marcelle Overlie Plume,acting as a scribe for Vanna Scotland, MD.,have documented all relevant documentation on the behalf of Vanna Scotland, MD,as directed by  Vanna Scotland, MD while in the presence of Vanna Scotland, MD.  10/08/2022 9:35 AM   Court Joy 03/14/1960 [id number]  Referring provider: Marina Goodell, MD [address] [place],  [place] [address]  Chief Complaint Patient presents with  Follow-up   HPI: 62 year-old male who returns today for follow up for prostate cancer, lower urinary tract symptoms, and erectile dysfunction.   He underwent a prostate biopsy in 08/2017 for abnormal PSA. Prostate volume 35 g. Past 1/12 cores positive Gleason 3+3 adenocarcinoma (left lateral mid, 4%).   MRI prior to confirmatory biopsy 46 g prostate; PI-RADS 3 lesion R PZ. MR fusion biopsy 08/27/2019 2/4 ROI biopsies positive Gleason 3+3 adenocarcinoma; 4/12 biopsies positive for Gleason 3+3 adenocarcinoma with tissue involvement ranging 10-90%; 1/12 core positive Gleason 3+4 adenocarcinoma (30%)   He ultimately elected to undergo placement of brachytherapy at I-125 seeds which was completed in 06/2020. He did not receive ADT.   His most recent PSA on 10/02/2022 was 0.8.   He had previously had mild ED and was been using tadalafil having switched from sildenafil, which he felt was ineffective. He has not used tadalafil for the past two to three months and is uncertain if he needs it. He had also been taking Flomax for his urinary issues.    Results for orders placed or performed in visit on 10/08/22 Bladder Scan (Post Void Residual) in office Result Value Ref Range Scan Result 53 ml  IPSS  Row Name 10/08/22 0800 International Prostate Symptom Score How often have you had the sensation of not emptying your bladder? Less than 1 in 5 How often have you had to urinate less than every two hours? Less than 1 in 5 times How often have you found you stopped and started again several  times when you urinated? Not at All How often have you found it difficult to postpone urination? Not at All How often have you had a weak urinary stream? Less than 1 in 5 times How often have you had to strain to start urination? Not at All How many times did you typically get up at night to urinate? 1 Time Total IPSS Score 4 Quality of Life due to urinary symptoms If you were to spend the rest of your life with your urinary condition just the way it is now how would you feel about that? Pleased    Score:  1-7 Mild 8-19 Moderate 20-35 Severe    PMH: Past Medical History: Diagnosis Date  Alcohol abuse  Cancer ([facility]) PROSTATE  Elevated PSA  Elevated PSA  GERD (gastroesophageal reflux disease)  History of kidney stones  HLD (hyperlipidemia)  Hypertension   Surgical History: Past Surgical History: Procedure Laterality Date  COLONOSCOPY WITH PROPOFOL N/A 10/09/2014 Procedure: COLONOSCOPY WITH PROPOFOL;  Surgeon: Christena Deem, MD;  Location: [facility] ENDOSCOPY;  Service: Endoscopy;  Laterality: N/A;  radiation  05/2020 prostate  RADIOACTIVE SEED IMPLANT N/A 07/02/2020 Procedure: RADIOACTIVE SEED IMPLANT/BRACHYTHERAPY IMPLANT;  Surgeon: Vanna Scotland, MD;  Location: [facility] ORS;  Service: Urology;  Laterality: N/A;  removal of bladder stone   Home Medications:  Allergies as of 10/08/2022  No Known Allergies  Medication List   Accurate as of October 08, 2022  9:35 AM. If you have any questions, ask your nurse or doctor.   STOP taking these medications  tamsulosin 0.4 MG Caps capsule Commonly known as: FLOMAX Stopped by: Vanna Scotland   TAKE these medications   fluticasone 50 MCG/ACT nasal spray Commonly known as: FLONASE Place into both nostrils. lisinopril-hydrochlorothiazide 20-12.5 MG tablet Commonly known as: ZESTORETIC TAKE ONE (1) TABLET EACH DAY rosuvastatin 5 MG tablet Commonly known as: CRESTOR Take 5 mg by mouth  daily. tadalafil 10 MG tablet Commonly known as: CIALIS Take by mouth.    Family History: Family History Problem Relation Age of Onset  Kidney failure Mother     transplant  Prostate cancer Maternal Grandfather  Bladder Cancer Neg Hx   Social History:  reports that he quit smoking about 35 years ago. His smoking use included cigarettes. He has never used smokeless tobacco. He reports that he does not drink alcohol and does not use drugs.   Physical Exam: BP 131/79   Pulse 71   Wt 220 lb (99.8 kg)   BMI 35.51 kg/m   Constitutional:  Alert and oriented, No acute distress. HEENT: [place] AT, moist mucus membranes.  Trachea midline, no masses. Neurologic: Grossly intact, no focal deficits, moving all 4 extremities. Psychiatric: Normal mood and affect.   Assessment & Plan:    1. Prostate cancer - No evidence of disease - Continue to check PSA on a Q6 basis  2. BPH with lower urinary tract symptoms - IPSS today is excellent and he is emptying adequately - Okay to stop Flomax and he will let us know if he needs to resume it based on symptoms  3. Erectile dysfunction - Viagra was not effective and he was switched to Cialis by his primary care but he is not sure if it is working either. It is a 10 mg dose and he can go up to 20 mg.  - Advised on strategies for taking this medication including at least 2 hours prior to sexual intercourse of which he was not aware   Return in about 6 months (around 04/10/2023) for repeat PSA in lab. Return in 1 year for office visit with PSA, IPSS, and PVR. .  I have reviewed the above documentation for accuracy and completeness, and I agree with the above.   Vanna Scotland, MD   [facility] [address], [address] [place], [place] [address] [phone]

## 2022-10-21 ENCOUNTER — Other Ambulatory Visit: Payer: Self-pay | Admitting: Urology

## 2023-01-14 ENCOUNTER — Other Ambulatory Visit: Payer: Self-pay | Admitting: Urology

## 2023-04-10 ENCOUNTER — Other Ambulatory Visit: Payer: BC Managed Care – PPO

## 2023-04-13 ENCOUNTER — Other Ambulatory Visit: Payer: Self-pay

## 2023-04-13 DIAGNOSIS — C61 Malignant neoplasm of prostate: Secondary | ICD-10-CM

## 2023-04-14 ENCOUNTER — Other Ambulatory Visit: Payer: BC Managed Care – PPO

## 2023-04-14 DIAGNOSIS — C61 Malignant neoplasm of prostate: Secondary | ICD-10-CM

## 2023-04-15 LAB — PSA: Prostate Specific Ag, Serum: 0.9 ng/mL (ref 0.0–4.0)

## 2023-05-13 ENCOUNTER — Other Ambulatory Visit: Payer: Self-pay | Admitting: Urology

## 2023-05-27 ENCOUNTER — Other Ambulatory Visit: Payer: Self-pay | Admitting: *Deleted

## 2023-05-27 DIAGNOSIS — C61 Malignant neoplasm of prostate: Secondary | ICD-10-CM

## 2023-06-01 ENCOUNTER — Inpatient Hospital Stay: Payer: BC Managed Care – PPO | Attending: Radiation Oncology

## 2023-06-01 DIAGNOSIS — C61 Malignant neoplasm of prostate: Secondary | ICD-10-CM | POA: Insufficient documentation

## 2023-06-01 LAB — PSA: Prostatic Specific Antigen: 0.37 ng/mL (ref 0.00–4.00)

## 2023-06-08 ENCOUNTER — Ambulatory Visit
Admission: RE | Admit: 2023-06-08 | Discharge: 2023-06-08 | Disposition: A | Discharge status: Home / Self Care | Payer: BC Managed Care – PPO | Source: Ambulatory Visit | Attending: Radiation Oncology | Admitting: Radiation Oncology

## 2023-06-08 ENCOUNTER — Encounter: Payer: Self-pay | Admitting: Radiation Oncology

## 2023-06-08 VITALS — BP 177/88 | HR 82 | Temp 96.5°F | Resp 16 | Ht 66.0 in | Wt 228.1 lb

## 2023-06-08 DIAGNOSIS — C61 Malignant neoplasm of prostate: Secondary | ICD-10-CM | POA: Diagnosis present

## 2023-06-08 DIAGNOSIS — Z923 Personal history of irradiation: Secondary | ICD-10-CM | POA: Insufficient documentation

## 2023-06-08 NOTE — Progress Notes (Signed)
 Radiation Oncology Follow up Note  Name: Ray Saunders   Date:   06/08/2023 MRN:  [id number] DOB: 1960/12/06    This 63 y.o. male presents to the clinic today for close to 3-year follow-up status post I-125 interstitial implant for Gleason 6 adenocarcinoma the prostate.  REFERRING PROVIDER: Lorrie Rothman, MD  HPI: Patient is a 63 year old male now out close to 3 years having completed I-125 interstitial implant for Gleason 6 adenocarcinoma the prostate.  Seen today in routine follow-up he is doing well.  He specifically denies any increased lower urinary tract symptoms diarrhea or fatigue.  His most recent PSA this month was 0.37 down from last year's.  PSA of 1.06  COMPLICATIONS OF TREATMENT: none  FOLLOW UP COMPLIANCE: keeps appointments   PHYSICAL EXAM:  BP (!) 177/88   Pulse 82   Temp (!) 96.5 F (35.8 C) (Tympanic)   Resp 16   Ht 5\' 6"  (1.676 m)   Wt 228 lb 1.6 oz (103.5 kg)   BMI 36.82 kg/m  Well-developed well-nourished patient in NAD. HEENT reveals PERLA, EOMI, discs not visualized.  Oral cavity is clear. No oral mucosal lesions are identified. Neck is clear without evidence of cervical or supraclavicular adenopathy. Lungs are clear to A&P. Cardiac examination is essentially unremarkable with regular rate and rhythm without murmur rub or thrill. Abdomen is benign with no organomegaly or masses noted. Motor sensory and DTR levels are equal and symmetric in the upper and lower extremities. Cranial nerves II through XII are grossly intact. Proprioception is intact. No peripheral adenopathy or edema is identified. No motor or sensory levels are noted. Crude visual fields are within normal range.  RADIOLOGY RESULTS: No current films to review  PLAN: Present time patient is doing well with excellent biochemical control of his prostate cancer.  He continues to see urology twice year I am going to turn follow-up care over to them.  I be happy to reevaluate the patient in time  should it be indicated.  Patient knows to call with any concerns.  I would like to take this opportunity to thank you for allowing me to participate in the care of your patient.Glenis Langdon, MD

## 2023-08-13 ENCOUNTER — Other Ambulatory Visit: Payer: Self-pay | Admitting: Urology

## 2023-08-18 ENCOUNTER — Ambulatory Visit
Admission: EM | Admit: 2023-08-18 | Discharge: 2023-08-18 | Disposition: A | Discharge status: Home / Self Care | Attending: Emergency Medicine | Admitting: Emergency Medicine

## 2023-08-18 DIAGNOSIS — I10 Essential (primary) hypertension: Secondary | ICD-10-CM | POA: Diagnosis not present

## 2023-08-18 DIAGNOSIS — B029 Zoster without complications: Secondary | ICD-10-CM | POA: Diagnosis not present

## 2023-08-18 MED ORDER — IBUPROFEN 600 MG PO TABS: 600.0000 mg | ORAL_TABLET | Freq: Four times a day (QID) | ORAL | 0 refills | Status: AC | PRN

## 2023-08-18 MED ORDER — MUPIROCIN 2 % EX OINT: 1.0000 | TOPICAL_OINTMENT | Freq: Two times a day (BID) | CUTANEOUS | 0 refills | Status: AC

## 2023-08-18 MED ORDER — VALACYCLOVIR HCL 1 G PO TABS: 1000.0000 mg | ORAL_TABLET | Freq: Three times a day (TID) | ORAL | 0 refills | Status: AC

## 2023-08-18 NOTE — Discharge Instructions (Signed)
 Start the Valtrex.  This is for shingles.  Bactroban will help prevent any secondary infection and will also help with the cut on your scalp heal.  Return here in several days if you are getting worse for reevaluation.  May take 600 mg of ibuprofen , 1000 mg of Tylenol  3-4 times a day as needed for pain.  Go to the ER for eye pain, visual changes, lights bothering your eyes, or for other concerns.  Decrease your salt intake. diet and exercise will lower your blood pressure significantly. It is important to keep your blood pressure under good control, as having a elevated blood pressure for prolonged periods of time significantly increases your risk of stroke, heart attacks, kidney damage, eye damage, and other problems. Get a validated blood pressure cuff that goes on your arm, not your wrist.  Measure your blood pressure once a day, preferably at the same time every day. Keep a log of this and bring it to your next doctor's appointment.  Bring your blood pressure cuff as well. Return immediately to the ER if you start having chest pain, headache, problems seeing, problems talking, problems walking, if you feel like you're about to pass out, if you do pass out, if you have a seizure, or for any other concerns.  Go to www.goodrx.com  or www.costplusdrugs.com to look up your medications. This will give you a list of where you can find your prescriptions at the most affordable prices. Or ask the pharmacist what the cash price is, or if they have any other discount programs available to help make your medication more affordable. This can be less expensive than what you would pay with insurance.

## 2023-08-18 NOTE — ED Triage Notes (Signed)
 Pt c/o insect bite on top of head x1 day. States area tender & red.

## 2023-08-18 NOTE — ED Provider Notes (Signed)
 HPI  SUBJECTIVE:  Ray Saunders is a 63 y.o. male who presents with an erythematous achy, erythematous rash along his left scalp starting yesterday.  He reports tenderness to palpation, localized swelling described as whelps.  He states that his left ear hurt starting 2 days ago.  No change in hearing, otorrhea.  He cut his scalp shaving 4 days ago, but states that this is healing.  No burning pain, pins-and-needles like sensation in the left V1 dermatome.  He has to wear a hard hat during the day.  He does not recall an insect bite to the area.  No fevers, body aches, headaches, visual changes, eye pain.  He tried alcohol and hydrogen peroxide without improvement in his symptoms.  No aggravating factors. He has a past medical history of prostate cancer, GERD, hyperlipidemia, hypertension.  Does not recall a history of varicella or shingles.  He did not get the shingles vaccine.  No history of chronic kidney disease.  [facility].  He has follow-up in the second week of July.  Past Medical History:  Diagnosis Date   Alcohol abuse    Cancer (HCC)    PROSTATE   Elevated PSA    Elevated PSA    GERD (gastroesophageal reflux disease)    History of kidney stones    HLD (hyperlipidemia)    Hypertension     Past Surgical History:  Procedure Laterality Date   COLONOSCOPY WITH PROPOFOL  N/A 10/09/2014   Procedure: COLONOSCOPY WITH PROPOFOL ;  Surgeon: Gladis RAYMOND Mariner, MD;  Location: [facility] ENDOSCOPY;  Service: Endoscopy;  Laterality: N/A;   radiation   05/2020   prostate   RADIOACTIVE SEED IMPLANT N/A 07/02/2020   Procedure: RADIOACTIVE SEED IMPLANT/BRACHYTHERAPY IMPLANT;  Surgeon: Penne Knee, MD;  Location: [facility] ORS;  Service: Urology;  Laterality: N/A;  removal of bladder stone    Family History  Problem Relation Age of Onset   Kidney failure Mother        transplant   Prostate cancer Maternal Grandfather    Bladder Cancer Neg Hx     Social History   Tobacco Use    Smoking status: Former    Current packs/day: 0.00    Types: Cigarettes    Quit date: 08/24/1987    Years since quitting: 36.0   Smokeless tobacco: Never  Vaping Use   Vaping status: Never Used  Substance Use Topics   Alcohol use: No    Alcohol/week: 0.0 standard drinks of alcohol   Drug use: No    No current facility-administered medications for this encounter.  Current Outpatient Medications:    ibuprofen  (ADVIL ) 600 MG tablet, Take 1 tablet (600 mg total) by mouth every 6 (six) hours as needed., Disp: 20 tablet, Rfl: 0   mupirocin ointment (BACTROBAN) 2 %, Apply 1 Application topically 2 (two) times daily., Disp: 22 g, Rfl: 0   valACYclovir (VALTREX) 1000 MG tablet, Take 1 tablet (1,000 mg total) by mouth 3 (three) times daily for 7 days., Disp: 21 tablet, Rfl: 0   fluticasone  (FLONASE ) 50 MCG/ACT nasal spray, Place into both nostrils., Disp: , Rfl:    lisinopril-hydrochlorothiazide (PRINZIDE,ZESTORETIC) 20-12.5 MG tablet, TAKE ONE (1) TABLET EACH DAY, Disp: , Rfl: 2   rosuvastatin (CRESTOR) 5 MG tablet, Take 5 mg by mouth daily., Disp: , Rfl:    tadalafil (CIALIS) 10 MG tablet, Take by mouth., Disp: , Rfl:   No Known Allergies   ROS  As noted in HPI.   Physical Exam  BP (!) 182/90 (BP Location: Left Arm)   Pulse 65   Temp 98.5 F (36.9 C) (Oral)   Resp 16   Ht 5' 6 (1.676 m)   Wt 102.1 kg   SpO2 98%   BMI 36.32 kg/m  BP Readings from Last 3 Encounters:  08/18/23 (!) 182/90  06/08/23 (!) 177/88  10/08/22 131/79    Constitutional: Well developed, well nourished, no acute distress Eyes:  EOMI, conjunctiva normal bilaterally HENT: Normocephalic, atraumatic,mucus membranes moist.  Left external ear, EAC, TM normal.  No rash surrounding the ear or face.  No tenderness around the eye or along the nasal bridge.  No tenderness in the rest of the V1 dermatome Respiratory: Normal inspiratory effort Cardiovascular: Normal rate GI: nondistended skin: Area of erythema,  swelling with papules that look like early vesicles.  Positive healing small abrasion in the area.  Musculoskeletal: no deformities Neurologic: Alert & oriented x 3, no focal neuro deficits Psychiatric: Speech and behavior appropriate   ED Course   Medications - No data to display  No orders of the defined types were placed in this encounter.   No results found for this or any previous visit (from the past 24 hours). No results found.  ED Clinical Impression  1. Herpes zoster without complication   2. Elevated blood pressure reading with diagnosis of hypertension      ED Assessment/Plan     Outside labs reviewed. ,  Calculate creatinine clearance on labs done in October 13 2496 mL/min.  1.  Rash.  Concern for shingles.  Contact dermatitis, cellulitis of the differential.  No evidence of ocular involvement at this time.  Will send home with for 7 days of Valtrex 1000 mg 3 times daily for 7 to 10 days.  Bactroban for the healing abrasion.  Tylenol /ibuprofen  as needed for pain.  His PCP is currently out of town, although he has follow-up with them in2 weeks.  May return here in 2 to 5 days if not getting better with these measures.  ER return precautions given.  2.  Elevated blood pressure reading with diagnosis of hypertension.  Patient otherwise asymptomatic.  States that he has not taken his medications today.  He does not measure his blood pressure at home.  Advised him to get a blood pressure cuff and to monitor it at home,  keep a log of this, and to follow-up with his PCP.  Discussed importance of keeping blood pressure under adequate control.  Written ER return precautions given.  Discussed MDM, treatment plan, and plan for follow-up with patient. Discussed sn/sx that should prompt return to the ED. patient agrees with plan.   Meds ordered this encounter  Medications   valACYclovir (VALTREX) 1000 MG tablet    Sig: Take 1 tablet (1,000 mg total) by mouth 3 (three) times  daily for 7 days.    Dispense:  21 tablet    Refill:  0   ibuprofen  (ADVIL ) 600 MG tablet    Sig: Take 1 tablet (600 mg total) by mouth every 6 (six) hours as needed.    Dispense:  20 tablet    Refill:  0   mupirocin ointment (BACTROBAN) 2 %    Sig: Apply 1 Application topically 2 (two) times daily.    Dispense:  22 g    Refill:  0      *This clinic note was created using Scientist, clinical (histocompatibility and immunogenetics). Therefore, there may be occasional mistakes despite careful proofreading.  ?  Van Knee, MD 08/18/23 1014

## 2023-10-13 ENCOUNTER — Ambulatory Visit: Admitting: Physician Assistant

## 2023-10-13 ENCOUNTER — Ambulatory Visit: Payer: Self-pay | Admitting: Urology

## 2023-10-13 DIAGNOSIS — R3912 Poor urinary stream: Secondary | ICD-10-CM

## 2024-01-18 ENCOUNTER — Telehealth: Payer: Self-pay | Admitting: Neurosurgery

## 2024-01-18 NOTE — Telephone Encounter (Signed)
Pt will be bringing

## 2024-01-18 NOTE — Telephone Encounter (Signed)
 Pt will be bringing disc from [facility] for an MRI, can you please pull the images from [facility] for an xray he had 08/2023? Thanks!

## 2024-01-19 NOTE — Telephone Encounter (Signed)
 Requested Xray images from [facility] 10/28/2023- Xray sacrum and coccyx 09/02/2023- Xray Lumbar spine with flexion and extension

## 2024-01-20 ENCOUNTER — Inpatient Hospital Stay
Admission: RE | Admit: 2024-01-20 | Discharge: 2024-01-20 | Disposition: A | Payer: Self-pay | Source: Ambulatory Visit | Attending: Neurosurgery | Admitting: Neurosurgery

## 2024-01-20 ENCOUNTER — Other Ambulatory Visit: Payer: Self-pay

## 2024-01-20 ENCOUNTER — Encounter: Payer: Self-pay | Admitting: Neurosurgery

## 2024-01-20 DIAGNOSIS — Z049 Encounter for examination and observation for unspecified reason: Secondary | ICD-10-CM

## 2024-01-20 NOTE — Telephone Encounter (Signed)
 There is a referral message note about this-images uploaded-need [facility] reports (see referral notes)

## 2024-02-02 ENCOUNTER — Encounter: Payer: Self-pay | Admitting: Neurosurgery

## 2024-02-09 NOTE — Telephone Encounter (Signed)
Imaging reports scanned into chart.

## 2024-02-09 NOTE — Telephone Encounter (Signed)
 Images uploaded-ready to schedule if receive [facility]

## 2024-02-10 NOTE — Telephone Encounter (Signed)
Left message for patient to call our office back to schedule.

## 2024-02-12 ENCOUNTER — Ambulatory Visit: Admission: EM | Admit: 2024-02-12 | Discharge: 2024-02-12 | Disposition: A | Discharge status: Home / Self Care

## 2024-02-12 DIAGNOSIS — G8929 Other chronic pain: Secondary | ICD-10-CM

## 2024-02-12 DIAGNOSIS — M5441 Lumbago with sciatica, right side: Secondary | ICD-10-CM | POA: Diagnosis not present

## 2024-02-12 DIAGNOSIS — M48061 Spinal stenosis, lumbar region without neurogenic claudication: Secondary | ICD-10-CM

## 2024-02-12 DIAGNOSIS — M5442 Lumbago with sciatica, left side: Secondary | ICD-10-CM

## 2024-02-12 MED ORDER — HYDROCODONE-ACETAMINOPHEN 5-325 MG PO TABS: 1.0000 | ORAL_TABLET | Freq: Four times a day (QID) | ORAL | 0 refills | Status: DC | PRN

## 2024-02-12 MED ORDER — GABAPENTIN 600 MG PO TABS: 600.0000 mg | ORAL_TABLET | Freq: Three times a day (TID) | ORAL | 0 refills | Status: DC

## 2024-02-12 MED ORDER — KETOROLAC TROMETHAMINE 30 MG/ML IJ SOLN
30.0000 mg | Freq: Once | INTRAMUSCULAR | Status: AC
  Administered 2024-02-12: 30 mg via INTRAMUSCULAR

## 2024-02-12 MED ORDER — PREDNISONE 10 MG PO TABS: ORAL_TABLET | ORAL | 0 refills | Status: DC

## 2024-02-12 NOTE — ED Provider Notes (Signed)
 " [facility]    CSN: [id number] Arrival date & time: 02/12/24  0825      History   Chief Complaint Chief Complaint  Patient presents with   Back Pain    HPI Ray Saunders is a 63 y.o. male with history of chronic back pain and lumbar stenosis greater than 6 months.  He says recently, his symptoms of gotten a lot worse after a coughing spell a few days ago.  Symptoms are worse with prolonged sitting.  Has a lot of difficulty getting comfortable at night and says the pain has been waking him up from sleep.  Pain is constant, sharp and shooting in his tailbone which radiates to bilateral buttocks and posterior thighs.  No numbness, tingling or weakness.  No loss of bowel or bladder control.  Has been taking ibuprofen  as needed, gabapentin 300 mg 3 times daily, and cyclobenzaprine.  He reports occasionally he takes 2 gabapentin instead of 1 because the pain is so bad.  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 Upcoming surgery consult January 13.   HPI  Past Medical History:  Diagnosis Date   Alcohol abuse    Cancer (HCC)    PROSTATE   Elevated PSA    Elevated PSA    GERD (gastroesophageal reflux disease)    History of kidney stones    HLD (hyperlipidemia)    Hypertension     Patient Active Problem List   Diagnosis Date Noted   Prostate cancer (HCC) 07/07/2018   Chest pain at rest 07/07/2017   Dyspnea on exertion 07/07/2017   Rising PSA level 10/17/2014   BP (high blood pressure) 08/24/2014   Abnormal prostate specific antigen 09/05/2013    HLD (hyperlipidemia) 09/05/2013    Past Surgical History:  Procedure Laterality Date   COLONOSCOPY WITH PROPOFOL  N/A 10/09/2014   Procedure: COLONOSCOPY WITH PROPOFOL ;  Surgeon: Gladis RAYMOND Mariner, MD;  Location[facility] ENDOSCOPY;  Service: Endoscopy;  Laterality: N/A;   radiation   05/2020   prostate   RADIOACTIVE SEED IMPLANT N/A 07/02/2020   Procedure: RADIOACTIVE SEED IMPLANT/BRACHYTHERAPY IMPLANT;  Surgeon: Penne Knee, MD;  Location: [facility] ORS;  Service: Urology;  Laterality: N/A;  removal of bladder stone       Home Medications    Prior to Admission medications  Medication Sig Start Date End Date Taking? Authorizing Provider  cyclobenzaprine (FLEXERIL) 5 MG tablet TAKE 1 TABLET BY MOUTH 3 TIMES DAILY AS NEEDED FOR MUSCLE SPASMS FOR UP TO 10 DAYS.   Yes [provider]  fluticasone  (FLONASE ) 50 MCG/ACT nasal spray Place into both nostrils. 03/03/21  Yes [provider]  gabapentin (NEURONTIN) 600 MG tablet Take 1 tablet (600 mg total) by mouth 3 (three) times daily. 02/12/24 03/13/24 Yes Arvis Huxley B, PA-C  HYDROcodone -acetaminophen  (NORCO/VICODIN) 5-325 MG tablet Take 1-2 tablets by mouth every 6 (six) hours as needed for severe pain (pain score 7-10). 02/12/24  Yes Arvis Huxley B, PA-C  ibuprofen  (ADVIL ) 600 MG tablet Take 1 tablet (600 mg total) by mouth every 6 (six) hours as needed. 08/18/23  Yes Van Knee, MD  lisinopril-hydrochlorothiazide (PRINZIDE,ZESTORETIC) 20-12.5 MG tablet TAKE ONE (1) TABLET EACH DAY 12/23/17  Yes [provider]  mupirocin  ointment (BACTROBAN ) 2 % Apply 1 Application topically 2 (two) times daily. 08/18/23  Yes Van Knee, MD  predniSONE (DELTASONE) 10 MG tablet Take 6 tabs p.o. on day 1 and decrease by 1 tablet daily until complete 02/12/24  Yes Arvis Huxley B, PA-C  rosuvastatin (CRESTOR) 5 MG tablet Take 5 mg by mouth daily. 03/08/22  Yes [provider]  tadalafil (CIALIS) 10 MG tablet Take by  mouth. 03/28/22  Yes [provider]    Family History Family History  Problem Relation Age of Onset   Kidney failure Mother        transplant   Prostate cancer Maternal Grandfather    Bladder Cancer Neg Hx     Social History Social History[1]   Allergies   Patient has no known allergies.   Review of Systems Review of Systems  Musculoskeletal:  Positive for arthralgias and back pain. Negative for gait problem and joint swelling.  Skin:  Negative for color change and wound.  Neurological:  Negative for weakness and numbness.     Physical Exam Triage Vital Signs ED Triage Vitals  Encounter Vitals Group     BP      Girls Systolic BP Percentile      Girls Diastolic BP Percentile      Boys Systolic BP Percentile      Boys Diastolic BP Percentile      Pulse      Resp      Temp      Temp src      SpO2      Weight      Height      Head Circumference      Peak Flow      Pain Score      Pain Loc      Pain Education      Exclude from Growth Chart    No data found.  Updated Vital Signs BP (!) 188/90 (BP Location: Left Arm)   Pulse 88   Temp 98.3 F (36.8 C) (Oral)   Resp 20   Wt 229 lb 6.4 oz (104.1 kg)   SpO2 99%   BMI 37.03 kg/m     Physical Exam Vitals and nursing note reviewed.  Constitutional:      General: He is not in acute distress.    Appearance: Normal appearance. He is well-developed. He is not ill-appearing.     Comments: Appears to be in significant pain.  He has a lot of guarding and wincing with movement.  With position change his face becomes beet red.  HENT:     Head: Normocephalic and atraumatic.  Eyes:     General: No scleral icterus.    Conjunctiva/sclera: Conjunctivae normal.  Cardiovascular:     Rate and Rhythm: Normal rate and regular rhythm.  Pulmonary:     Effort: Pulmonary effort is normal. No respiratory distress.     Breath sounds: Normal breath sounds.  Musculoskeletal:     Cervical back: Neck supple.      Comments: Patient appears uncomfortable when standing from a seated position.  He has no tenderness to palpation of his back.  He has reduced range of motion of his back.  When I asked him to lay on his back to check straight leg raise, he is unable to do it as the extending motion causes sharp and shooting pain in  his back and buttocks.  He does have full strength of bilateral lower extremities.  Skin:    General: Skin is warm and dry.     Capillary Refill: Capillary refill takes less than 2 seconds.  Neurological:     General: No focal deficit present.     Mental Status: He is alert. Mental status is at baseline.     Motor: No weakness.     Gait: Gait normal.  Psychiatric:        Mood and Affect: Mood normal.        Behavior: Behavior normal.      UC Treatments / Results  Labs (all labs ordered are listed, but only abnormal results are displayed) Labs Reviewed - No data to display  EKG   Radiology No results found.       Procedures Procedures (including critical care time)  Medications Ordered in UC Medications  ketorolac  (TORADOL ) 30 MG/ML injection 30 mg (30 mg Intramuscular Given 02/12/24 0917)    Initial Impression / Assessment and Plan / UC Course  I have reviewed the triage vital signs and the nursing notes.  Pertinent labs & imaging results that were available during my care of the patient were reviewed by me and considered in my medical decision making (see chart for details).   63 year old male presents with wife for chronic low back pain greater than 6 months with recent worsening of symptoms.  Pain is severe in the tailbone and radiates to bilateral buttocks and thighs.  No numbness or weakness.  No loss of bowel or bladder control or saddle anesthesia.  Reviewed MRI findings from July 2025.  Included above.  Patient has severe spinal canal stenosis.  Has disc extrusion.  Reviewed notes from PT as well as physical medicine.  At this time we will  increase patient's gabapentin dose to 600 mg 3 times daily.  He was given ketorolac  injection 30 mg IM.  Sent prednisone taper.  Norco sent for severe pain, primarily at nighttime.  Patient has a surgery consult in 1 month.  Advised to keep the consult but go sooner if there is a cancellation.  We reviewed going to ED if any acute worsening of pain, saddle anesthesia, leg weakness, loss of bowel or bladder control.  Patient is agreeable.    Final Clinical Impressions(s) / UC Diagnoses   Final diagnoses:  Chronic midline low back pain with bilateral sciatica  Spinal stenosis of lumbar region, unspecified whether neurogenic claudication present     Discharge Instructions      - You were given an injection of Toradol  anti-inflammatory medicine in the clinic. - I increased your gabapentin dose, sent prednisone taper and also sent a few Norco as needed for severe pain.  Continue with muscle relaxer as needed and keep your follow-up appointment for surgery consult next month. - Go immediately to the ER if your legs become weak, you have loss of bowel or bladder control or numbness in your groin.     ED Prescriptions     Medication Sig Dispense Auth. Provider   HYDROcodone -acetaminophen  (NORCO/VICODIN) 5-325 MG tablet Take 1-2 tablets by mouth every 6 (six) hours as needed for severe pain (pain score 7-10). 20 tablet Arvis Huxley B, PA-C   predniSONE (DELTASONE) 10 MG tablet Take 6 tabs p.o. on day 1 and decrease by 1 tablet daily until complete 21 tablet Arvis Huxley B, PA-C   gabapentin (NEURONTIN) 600 MG tablet Take 1 tablet (600 mg total) by mouth  3 (three) times daily. 90 tablet Arvis Jolan NOVAK, PA-C      I have reviewed the PDMP during this encounter.     [1]  Social History Tobacco Use   Smoking status: Former    Current packs/day: 0.00    Types: Cigarettes    Quit date: 08/24/1987    Years since quitting: 36.4   Smokeless tobacco: Never  Vaping Use   Vaping status:  Never Used  Substance Use Topics   Alcohol use: No    Alcohol/week: 0.0 standard drinks of alcohol   Drug use: No     Arvis Jolan NOVAK, PA-C 02/12/24 9052  "

## 2024-02-12 NOTE — ED Triage Notes (Signed)
 Pt is with his wife  Pt c/o back pain x22months  Pt states that the pain is in his lower back, tailbone, and buttcheeks.  Pt states that the pain has been worse starting at 3am this morning  Pt denies any new physical activity or lifestyle changes  Pt goes to see a surgeon on January 13th

## 2024-02-12 NOTE — Discharge Instructions (Signed)
-   You were given an injection of Toradol  anti-inflammatory medicine in the clinic. - I increased your gabapentin dose, sent prednisone taper and also sent a few Norco as needed for severe pain.  Continue with muscle relaxer as needed and keep your follow-up appointment for surgery consult next month. - Go immediately to the ER if your legs become weak, you have loss of bowel or bladder control or numbness in your groin.

## 2024-03-02 NOTE — Progress Notes (Signed)
 "   Referring Physician:  Dodson Delon FERNS, MD [address] [place],  [place] [address]  Primary Physician:  Ray Cheryl BRAVO, MD  History of Present Illness: 03/08/2024 [name] is here today with a chief complaint of  lumbar disc herniation and prior right foot drop who presents for evaluation of persistent lower back and buttock pain.  For 6 months he has had severe pain in the coccyx and bilateral buttocks that is markedly worsened by sitting, often waking him from sleep. Standing and walking relieve symptoms, and he can stand for prolonged periods without pain. Sitting, even briefly, provokes intense buttock and tailbone pain described as muscle spasms. Pain can spread across the lower back and rarely into the groin. He had one brief episode of right leg numbness into the foot that resolved. He denies ongoing leg pain, numbness, or weakness.  In July 2025 he developed an acute right foot drop, treated in the emergency department with an injection and dry needling. Right foot strength has fully recovered and is now symmetric. Surgery was discussed at that time, but he chose conservative care. Physical therapy aggravated lumbar pain with direct pressure and was not helpful. He has had at least one injection, anti-inflammatory medications, and other conservative measures without meaningful relief.  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 He has been on light duty for 6 months due to pain with sitting.  His wife reports he cannot stand up straight.    Discussed the use of AI scribe software for clinical note transcription with the patient, who gave verbal consent to proceed.   Weakness: none Timing: constant Bowel/Bladder Dysfunction: none  Conservative measures:  Physical therapy:  has participated in at [facility] from 12/30/23 to 01/20/24 and has been discharged.  Multimodal medical therapy including  regular antiinflammatories:  gabapentin , tizanidine, celebrex, ibuprofen , prednisone , hydrocodone    Injections:   12/18/23: Bilateral L3, L4 medial branch and dorsal rami blocks(no relief) 12/03/23: Bilateral S1 TF ESI  10/07/23: Bilateral L2-3 TF ESI at [facility]    Past Surgery: no spinal surgeries   Ray Saunders has no symptoms of cervical myelopathy.  The symptoms are causing a significant impact on the patient's life.   I have utilized the care everywhere function in epic to review the outside records available from external health systems.   Review of Systems:  A 10 point review of systems is negative, except for the pertinent positives and negatives detailed in the HPI.  Past Medical History: Past Medical History:  Diagnosis Date   Alcohol abuse    Cancer (HCC)    PROSTATE   Elevated PSA    Elevated PSA    GERD (gastroesophageal reflux disease)    History of kidney stones    HLD (hyperlipidemia)    Hypertension     Past Surgical History: Past Surgical History:  Procedure Laterality Date   COLONOSCOPY WITH PROPOFOL  N/A 10/09/2014   Procedure: COLONOSCOPY WITH PROPOFOL ;  Surgeon: Gladis RAYMOND Mariner, MD;  Location: [facility] ENDOSCOPY;  Service: Endoscopy;  Laterality: N/A;   radiation   05/2020   prostate   RADIOACTIVE SEED IMPLANT N/A 07/02/2020   Procedure: RADIOACTIVE SEED IMPLANT/BRACHYTHERAPY IMPLANT;  Surgeon: Penne Knee, MD;  Location: [facility] ORS;  Service: Urology;  Laterality: N/A;  removal of bladder stone    Allergies: Allergies as of 03/08/2024   (No Known Allergies)    Medications: Current Medications[1]  Social History: Social History[2]  Family Medical History: Family History  Problem Relation Age of Onset   Kidney failure Mother        transplant   Prostate cancer Maternal Grandfather    Bladder Cancer Neg Hx     Physical Examination: Vitals:   03/08/24 0901  BP: (!) 226/108    General: Patient is in no apparent distress.  Attention to examination is appropriate.  Neck:   Supple.  Full range of motion.  Respiratory: Patient is breathing without any difficulty.   NEUROLOGICAL:     Awake, alert, oriented to person, place, and time.  Speech is clear and fluent.   Cranial Nerves: Pupils equal round and reactive to light.  Facial tone is symmetric.  Facial sensation is symmetric. Shoulder shrug is symmetric. Tongue protrusion is midline.  There is no pronator drift.  Strength: Side Biceps Triceps Deltoid Interossei Grip Wrist Ext. Wrist Flex.  R 5 5 5 5 5 5 5   L 5 5 5 5 5 5 5    Side Iliopsoas Quads Hamstring PF DF EHL  R 5 5 5 5 5 5   L 5 5 5 5 5 5    Reflexes are 1+ and symmetric at the biceps, triceps, brachioradialis, patella and achilles.   Hoffman's is absent.   Bilateral upper and lower extremity sensation is intact to light touch.    No evidence of dysmetria noted.  Gait is antalgic.     Medical Decision Making  Imaging: MRI L spine 02/08/2024 Severe central canal stenosis at L2-3 due to disc bulge with central zone extrusion and caudal migration.  There is severe stenosis below the disc space.  Moderate canal stenosis at L1-2.  Moderate foraminal narrowing at L2-3, L4-5, and L5-S1  I have personally reviewed the images and agree with the above interpretation.  Assessment and Plan: [name] is a pleasant 64 y.o. 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  He has tried and failed conservative management including physical therapy, medications, and injections.  There is no further conservative management indicated.  Have recommended L2-3 decompression with possible discectomy.  I discussed the planned procedure at length with the patient, including the risks, benefits, alternatives, and indications. The risks discussed include but are not limited to bleeding, infection, need for reoperation, spinal fluid leak, stroke, vision loss, anesthetic  complication, coma, paralysis, and even death. I also described in detail that improvement was not guaranteed.  The patient expressed understanding of these risks, and asked that we proceed with surgery. I described the surgery in layman's terms, and gave ample opportunity for questions, which were answered to the best of my ability.   I spent a total of 30 minutes in this patient's care today. This time was spent reviewing pertinent records including imaging studies, obtaining and confirming history, performing a directed evaluation, formulating and discussing my recommendations, and documenting the visit within the medical record.      Thank you for involving me in the care of this patient.      Kahiau Schewe K. Clois MD, [facility] Neurosurgery     [1]  Current Outpatient Medications:    amLODipine (NORVASC) 2.5 MG tablet, Take 2.5 mg by mouth., Disp: , Rfl:    amoxicillin-clavulanate (AUGMENTIN) 875-125 MG tablet, Take 875 mg by mouth., Disp: , Rfl:    cyclobenzaprine (FLEXERIL) 5 MG tablet, TAKE 1 TABLET BY MOUTH 3 TIMES DAILY AS NEEDED FOR MUSCLE SPASMS FOR UP TO 10 DAYS., Disp: , Rfl:  fluticasone  (FLONASE ) 50 MCG/ACT nasal spray, Place into both nostrils., Disp: , Rfl:    gabapentin  (NEURONTIN ) 600 MG tablet, Take 1 tablet (600 mg total) by mouth 3 (three) times daily., Disp: 90 tablet, Rfl: 0   HYDROcodone -acetaminophen  (NORCO/VICODIN) 5-325 MG tablet, Take 1-2 tablets by mouth every 6 (six) hours as needed for severe pain (pain score 7-10)., Disp: 20 tablet, Rfl: 0   ibuprofen  (ADVIL ) 600 MG tablet, Take 1 tablet (600 mg total) by mouth every 6 (six) hours as needed., Disp: 20 tablet, Rfl: 0   lisinopril-hydrochlorothiazide (PRINZIDE,ZESTORETIC) 20-12.5 MG tablet, TAKE ONE (1) TABLET EACH DAY, Disp: , Rfl: 2   mupirocin  ointment (BACTROBAN ) 2 %, Apply 1 Application topically 2 (two) times daily., Disp: 22 g, Rfl: 0   rosuvastatin (CRESTOR) 5 MG tablet, Take 5 mg by mouth daily.,  Disp: , Rfl:    tadalafil (CIALIS) 10 MG tablet, Take by mouth., Disp: , Rfl:  [2]  Social History Tobacco Use   Smoking status: Former    Current packs/day: 0.00    Types: Cigarettes    Quit date: 08/24/1987    Years since quitting: 36.5   Smokeless tobacco: Never  Vaping Use   Vaping status: Never Used  Substance Use Topics   Alcohol use: No    Alcohol/week: 0.0 standard drinks of alcohol   Drug use: No   "

## 2024-03-02 NOTE — H&P (View-Only) (Signed)
 "   Referring Physician:  Dodson Delon FERNS, MD [address] [place],  [place] [address]  Primary Physician:  Jeffie Cheryl BRAVO, MD  History of Present Illness: 03/08/2024 [name] is here today with a chief complaint of  lumbar disc herniation and prior right foot drop who presents for evaluation of persistent lower back and buttock pain.  For 6 months he has had severe pain Ray the coccyx and bilateral buttocks that is markedly worsened by sitting, often waking him from sleep. Standing and walking relieve symptoms, and he can stand for prolonged periods without pain. Sitting, even briefly, provokes intense buttock and tailbone pain described as muscle spasms. Pain can spread across the lower back and rarely into the groin. He had one brief episode of right leg numbness into the foot that resolved. He denies ongoing leg pain, numbness, or weakness.  Ray July 2025 he developed an acute right foot drop, treated Ray the emergency department with an injection and dry needling. Right foot strength has fully recovered and is now symmetric. Surgery was discussed at that time, but he chose conservative care. Physical therapy aggravated lumbar pain with direct pressure and was not helpful. He has had at least one injection, anti-inflammatory medications, and other conservative measures without meaningful relief.  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 He has been on light duty for 6 months due to pain with sitting.  His wife reports he cannot stand up straight.    Discussed the use of AI scribe software for clinical note transcription with the patient, who gave verbal consent to proceed.   Weakness: none Timing: constant Bowel/Bladder Dysfunction: none  Conservative measures:  Physical therapy:  has participated Ray at [facility] from 12/30/23 to 01/20/24 and has been discharged.  Multimodal medical therapy including  regular antiinflammatories:  gabapentin , tizanidine, celebrex, ibuprofen , prednisone , hydrocodone    Injections:   12/18/23: Bilateral L3, L4 medial branch and dorsal rami blocks(no relief) 12/03/23: Bilateral S1 TF ESI  10/07/23: Bilateral L2-3 TF ESI at [facility]    Past Surgery: no spinal surgeries   OBINNA Saunders has no symptoms of cervical myelopathy.  The symptoms are causing a significant impact on the patient's life.   I have utilized the care everywhere function Ray epic to review the outside records available from external health systems.   Review of Systems:  A 10 point review of systems is negative, except for the pertinent positives and negatives detailed Ray the HPI.  Past Medical History: Past Medical History:  Diagnosis Date   Alcohol abuse    Cancer (HCC)    PROSTATE   Elevated PSA    Elevated PSA    GERD (gastroesophageal reflux disease)    History of kidney stones    HLD (hyperlipidemia)    Hypertension     Past Surgical History: Past Surgical History:  Procedure Laterality Date   COLONOSCOPY WITH PROPOFOL  N/A 10/09/2014   Procedure: COLONOSCOPY WITH PROPOFOL ;  Surgeon: Gladis RAYMOND Mariner, MD;  Location: [facility] ENDOSCOPY;  Service: Endoscopy;  Laterality: N/A;   radiation   05/2020   prostate   RADIOACTIVE SEED IMPLANT N/A 07/02/2020   Procedure: RADIOACTIVE SEED IMPLANT/BRACHYTHERAPY IMPLANT;  Surgeon: Penne Knee, MD;  Location: [facility] ORS;  Service: Urology;  Laterality: N/A;  removal of bladder stone    Allergies: Allergies as of 03/08/2024   (No Known Allergies)    Medications: Current Medications[1]  Social History: Social History[2]  Family Medical History: Family History  Problem Relation Age of Onset   Kidney failure Mother        transplant   Prostate cancer Maternal Grandfather    Bladder Cancer Neg Hx     Physical Examination: Vitals:   03/08/24 0901  BP: (!) 226/108    General: Patient is Ray no apparent distress.  Attention to examination is appropriate.  Neck:   Supple.  Full range of motion.  Respiratory: Patient is breathing without any difficulty.   NEUROLOGICAL:     Awake, alert, oriented to person, place, and time.  Speech is clear and fluent.   Cranial Nerves: Pupils equal round and reactive to light.  Facial tone is symmetric.  Facial sensation is symmetric. Shoulder shrug is symmetric. Tongue protrusion is midline.  There is no pronator drift.  Strength: Side Biceps Triceps Deltoid Interossei Grip Wrist Ext. Wrist Flex.  R 5 5 5 5 5 5 5   L 5 5 5 5 5 5 5    Side Iliopsoas Quads Hamstring PF DF EHL  R 5 5 5 5 5 5   L 5 5 5 5 5 5    Reflexes are 1+ and symmetric at the biceps, triceps, brachioradialis, patella and achilles.   Hoffman's is absent.   Bilateral upper and lower extremity sensation is intact to light touch.    No evidence of dysmetria noted.  Gait is antalgic.     Medical Decision Making  Imaging: MRI L spine 02/08/2024 Severe central canal stenosis at L2-3 due to disc bulge with central zone extrusion and caudal migration.  There is severe stenosis below the disc space.  Moderate canal stenosis at L1-2.  Moderate foraminal narrowing at L2-3, L4-5, and L5-S1  I have personally reviewed the images and agree with the above interpretation.  Assessment and Plan: [name] is a pleasant 64 y.o. 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  He has tried and failed conservative management including physical therapy, medications, and injections.  There is no further conservative management indicated.  Have recommended L2-3 decompression with possible discectomy.  I discussed the planned procedure at length with the patient, including the risks, benefits, alternatives, and indications. The risks discussed include but are not limited to bleeding, infection, need for reoperation, spinal fluid leak, stroke, vision loss, anesthetic  complication, coma, paralysis, and even death. I also described Ray detail that improvement was not guaranteed.  The patient expressed understanding of these risks, and asked that we proceed with surgery. I described the surgery Ray layman's terms, and gave ample opportunity for questions, which were answered to the best of my ability.   I spent a total of 30 minutes Ray this patient's care today. This time was spent reviewing pertinent records including imaging studies, obtaining and confirming history, performing a directed evaluation, formulating and discussing my recommendations, and documenting the visit within the medical record.      Thank you for involving me Ray the care of this patient.      Kahiau Schewe K. Clois MD, [facility] Neurosurgery     [1]  Current Outpatient Medications:    amLODipine (NORVASC) 2.5 MG tablet, Take 2.5 mg by mouth., Disp: , Rfl:    amoxicillin-clavulanate (AUGMENTIN) 875-125 MG tablet, Take 875 mg by mouth., Disp: , Rfl:    cyclobenzaprine (FLEXERIL) 5 MG tablet, TAKE 1 TABLET BY MOUTH 3 TIMES DAILY AS NEEDED FOR MUSCLE SPASMS FOR UP TO 10 DAYS., Disp: , Rfl:  fluticasone  (FLONASE ) 50 MCG/ACT nasal spray, Place into both nostrils., Disp: , Rfl:    gabapentin  (NEURONTIN ) 600 MG tablet, Take 1 tablet (600 mg total) by mouth 3 (three) times daily., Disp: 90 tablet, Rfl: 0   HYDROcodone -acetaminophen  (NORCO/VICODIN) 5-325 MG tablet, Take 1-2 tablets by mouth every 6 (six) hours as needed for severe pain (pain score 7-10)., Disp: 20 tablet, Rfl: 0   ibuprofen  (ADVIL ) 600 MG tablet, Take 1 tablet (600 mg total) by mouth every 6 (six) hours as needed., Disp: 20 tablet, Rfl: 0   lisinopril-hydrochlorothiazide (PRINZIDE,ZESTORETIC) 20-12.5 MG tablet, TAKE ONE (1) TABLET EACH DAY, Disp: , Rfl: 2   mupirocin  ointment (BACTROBAN ) 2 %, Apply 1 Application topically 2 (two) times daily., Disp: 22 g, Rfl: 0   rosuvastatin (CRESTOR) 5 MG tablet, Take 5 mg by mouth daily.,  Disp: , Rfl:    tadalafil (CIALIS) 10 MG tablet, Take by mouth., Disp: , Rfl:  [2]  Social History Tobacco Use   Smoking status: Former    Current packs/day: 0.00    Types: Cigarettes    Quit date: 08/24/1987    Years since quitting: 36.5   Smokeless tobacco: Never  Vaping Use   Vaping status: Never Used  Substance Use Topics   Alcohol use: No    Alcohol/week: 0.0 standard drinks of alcohol   Drug use: No   "

## 2024-03-08 ENCOUNTER — Encounter: Payer: Self-pay | Admitting: Neurosurgery

## 2024-03-08 ENCOUNTER — Ambulatory Visit: Admitting: Neurosurgery

## 2024-03-08 ENCOUNTER — Other Ambulatory Visit: Payer: Self-pay

## 2024-03-08 VITALS — BP 196/118 | Ht 66.0 in | Wt 227.0 lb

## 2024-03-08 DIAGNOSIS — Z01818 Encounter for other preprocedural examination: Secondary | ICD-10-CM

## 2024-03-08 DIAGNOSIS — M48062 Spinal stenosis, lumbar region with neurogenic claudication: Secondary | ICD-10-CM

## 2024-03-08 NOTE — Patient Instructions (Addendum)
 Contact Dr Jeffie about your elevated blood pressure. Proceed to ER if having chest pain, headache, vision problems.   Please see below for information in regards to your upcoming surgery:   Planned surgery: L2/3 decompression   Surgery date: 03/23/24 at [facility]: [address], [place], [place] [address]) - you will find out your arrival time the business day before your surgery.   Pre-op appointment at [facility] Pre-admit Testing: you will receive a call with a date/time for this appointment. If you are scheduled for an in person appointment, Pre-admit Testing is located on the first floor of the [facility] building, [address] [facility], [address]. During this appointment, they will advise you which medications you can take the morning of surgery, and which medications you will need to hold for surgery. Labs (such as blood work, EKG) may be done at your pre-op appointment. You are not required to fast for these labs. Should you need to change your pre-op appointment, please call Pre-admit testing at [phone].   Please bring your medication bottles or an up to date medication list to your pre-admit testing appointment (regardless of whether we have a list in your chart).     Surgical clearance: we will send a clearance form to Dr Jeffie. They may wish to see you in their office prior to signing the clearance form. If so, they may call you to schedule an appointment.    Common restrictions after spine surgery: No bending, lifting, or twisting (BLT). Avoid lifting objects heavier than 10 pounds for the first 6 weeks after surgery. Where possible, avoid household activities that involve lifting, bending, reaching, pushing, or pulling such as laundry, vacuuming, grocery shopping, and childcare. Try to arrange for help from friends and family for these activities while you heal. Do not drive while taking prescription pain  medication. Weeks 6 through 12 after surgery: avoid lifting more than 25 pounds.    How to contact us :  If you have any questions/concerns before or after surgery, you can reach us  at [phone], or you can send a mychart message. We can be reached by phone or mychart 8am-4pm, Monday-Friday.  *Please note: Calls after 4pm are forwarded to a third party answering service. Mychart messages are not routinely monitored during evenings, weekends, and holidays. Please call our office to contact the answering service for urgent concerns during non-business hours.    If you have FMLA/disability paperwork, please drop it off or fax it to [phone]   Appointments/FMLA & disability paperwork: Reche Hait, & Nichole Registered Nurse/Surgery scheduler: Chong Wojdyla, RN & Katie, RN Certified Medical Assistants: Don, CMA, Elenor, CMA, Damien, CMA, & Auston, [place] Physician Assistants: Lyle Decamp, PA-C, Edsel Goods, PA-C & Glade Boys, PA-C Surgeons: Penne Sharps, MD & Reeves Daisy, MD   [facility] PREADMIT TESTING VISIT and SURGERY INFORMATION SHEET   Now that surgery has been scheduled you can anticipate several phone calls from [facility] services. A pharmacy technician will call you to verify your current list of medications taken at home.               The Pre-Service Center will call to verify your insurance information and to give you billing estimates and information.             The Preadmit Testing Office will be calling to schedule a visit to obtain information for the anesthesia team and provide instructions on preparation for surgery.  What can  you expect for the Preadmit Testing Visit: Appointments may be scheduled in-person or by telephone.  If a telephone visit is scheduled, you may be asked to come into the office to have lab tests or other studies performed.   This visit will not be completed any greater than 14 days prior to your surgery.  If  your surgery has been scheduled for a future date, please do not be alarmed if we have not contacted you to schedule an appointment more than a month prior to the surgery date.    Please be prepared to provide the following information during this appointment:            -Personal medical history                                               -Medication and allergy list            -Any history of problems with anesthesia              -Recent lab work or diagnostic studies            -Please notify us  of any needs we should be aware of to provide the best care possible           -You will be provided with instructions on how to prepare for your surgery.    On The Day of Surgery:  You must have a driver to take you home after surgery, you will be asked not to drive for 24 hours following surgery.  Taxi, Gisele and non-medical transport will not be acceptable means of transportation unless you have a responsible individual who will be traveling with you.  Visitors in the surgical area:   2 people will be able to visit you in your room once your preparation for surgery has been completed. During surgery, your visitors will be asked to wait in the Surgery Waiting Area.  It is not a requirement for them to stay, if they prefer to leave and come back.  Your visitor(s) will be given an update once the surgery has been completed.  No visitors are allowed in the initial recovery room to respect patient privacy and safety.  Once you are more awake and transfer to the secondary recovery area, or are transferred to an inpatient room, visitors will again be able to see you.  To respect and protect your privacy: We will ask on the day of surgery who your driver will be and what the contact number for that individual will be. We will ask if it is okay to share information with this individual, or if there is an alternative individual that we, or the surgeon, should contact to provide updates and  information. If family or friends come to the surgical information desk requesting information about you, who you have not listed with us , no information will be given.   It may be helpful to designate someone as the main contact who will be responsible for updating your other friends and family.    PREADMIT TESTING OFFICE: [phone] SAME DAY SURGERY: [phone] We look forward to caring for you before and throughout the process of your surgery.

## 2024-03-09 NOTE — Progress Notes (Signed)
 Ray Saunders is a 64 y.o. male that comes today for the following problem(s):   Chief Complaint  Patient presents with   Hypertension    First time checked yesterday 224/109- then down to 190/111 at second reading.     HPI: Patient in the office with concern about persistently and significantly elevated blood pressures recently.  He has been taking his lisinopril-hydrochlorothiazide 20-12.5 mg and his amlodipine 2.5 mg daily.  He denies headaches, vision changes, speech problems, focal neurologic symptoms, chest pain, shortness of breath or palpitations.  He takes his rosuvastatin regularly for his hyperlipidemia.  He is trying to eat healthy but his activity is significantly limited by low back pain.  He is being scheduled for lumbar laminectomy/decompression microdiscectomy but needs to get his blood pressures controlled first.   Goals      Maintain health/healthy lifestyle        Patient Active Problem List  Diagnosis   Hyperlipidemia   Elevated PSA   Hypertension   Chest pain at rest   Dyspnea on exertion   Lumbar spondylosis     Past Medical History:  Diagnosis Date   Elevated PSA    Esophageal reflux    H/O alcohol abuse    sober since 1996   Hyperlipidemia    Hyperplastic colon polyp 12/17/09   Hypertension    Kidney stone 10/12/2014   Tubular adenoma of colon 12/17/09     Past Surgical History:  Procedure Laterality Date   COLONOSCOPY  12/17/09   COLONOSCOPY  10/09/2014   Negative biopsy/PHx of polyps/Repeat 80yrs/MUS    Social History   Socioeconomic History   Marital status: Married   Number of children: 1  Occupational History   Occupation: Rock solicitor  Tobacco Use   Smoking status: Former    Current packs/day: 0.00    Average packs/day: 2.0 packs/day for 20.0 years (40.0 ttl pk-yrs)    Types: Cigarettes    Start date: 02/25/1967    Quit date: 02/25/1987    Years since quitting: 37.0   Smokeless tobacco: Never  Vaping Use    Vaping status: Never Used  Substance and Sexual Activity   Alcohol use: No    Alcohol/week: 0.0 standard drinks of alcohol    Comment: Sober since 1996   Drug use: No   Sexual activity: Yes    Partners: Female   Social Drivers of Corporate Investment Banker Strain: Low Risk  (12/30/2023)   Overall Financial Resource Strain (CARDIA)    Difficulty of Paying Living Expenses: Not hard at all  Food Insecurity: No Food Insecurity (12/30/2023)   Hunger Vital Sign    Worried About Programme Researcher, Broadcasting/film/video in the Last Year: Never true    Ran Out of Food in the Last Year: Never true  Transportation Needs: No Transportation Needs (12/30/2023)   PRAPARE - Administrator, Civil Service (Medical): No    Lack of Transportation (Non-Medical): No  Housing Stability: Low Risk  (12/30/2023)   Housing Stability Vital Sign    Unable to Pay for Housing in the Last Year: No    Number of Times Moved in the Last Year: 0    Homeless in the Last Year: No    Family History  Problem Relation Name Age of Onset   Alcohol abuse Father     ESRD-Transplant Mother     No Known Problems Sister       No Known Allergies  Prior to Admission medications  Medication Sig Taking? Last Dose  amLODIPine (NORVASC) 5 MG tablet Take 1 tablet (5 mg total) by mouth once daily Yes   amoxicillin-clavulanate (AUGMENTIN) 875-125 mg tablet Take 1 tablet (875 mg total) by mouth every 12 (twelve) hours for 10 days Yes Taking  fluticasone  propionate (FLONASE ) 50 mcg/actuation nasal spray PLACE 1 SPRAY INTO BOTH NOSTRILS 2 (TWO) TIMES DAILY Yes Taking  gabapentin  (NEURONTIN ) 300 MG capsule TAKE 1 CAPSULE BY MOUTH AT BEDTIME FOR 4 DAYS. 1 CAP TWICE A DAY X 4 DAYS. THEN 1 CAP 3 TIMES A DAY Yes Taking  gabapentin  (NEURONTIN ) 600 MG tablet Take 600 mg by mouth 3 (three) times daily Yes Taking  lisinopriL-hydroCHLOROthiazide (ZESTORETIC) 20-12.5 mg tablet TAKE 1 TABLET BY MOUTH EVERY DAY Patient taking  differently: Take 1 tablet by mouth every morning Yes Taking  rosuvastatin (CRESTOR) 5 MG tablet TAKE 1 TABLET BY MOUTH EVERY DAY Yes Taking  tadalafiL (CIALIS) 10 MG tablet TAKE 1 TABLET BY MOUTH ONCE DAILY FOR ERECTIRLE DYSFUNCTION FOR UP TO 30 DAYS. TAKE 30-44 MINS PRIOR TO ANTICIPATED SEXUAL ACTIVITY Yes PRN Not Currently Taking  tiZANidine (ZANAFLEX) 4 MG tablet TAKE 1/2-1 TABLET BY MOUTH THREE TIMES A DAY AS NEEDED Patient taking differently: at bedtime TAKE 1/2-1 TABLET BY MOUTH THREE TIMES A DAY AS NEEDED Yes Taking  ibuprofen  (MOTRIN ) 200 MG tablet Take 200 mg by mouth every 6 (six) hours as needed for Pain Patient not taking: Reported on 01/18/2024  Not Taking     Objective:  BP (!) 184/110 (BP Location: Left upper arm, Patient Position: Sitting, BP Cuff Size: Large Adult)   Pulse 88   Ht 167.6 cm (5' 6)   Wt (!) 105.2 kg (232 lb)   SpO2 97%   BMI 37.45 kg/m   Physical Examination:  GENERAL:  The patient is alert, oriented and in no acute distress.  HEENT:  Head is normocephalic/atraumatic.  Pupils equal, round and reactive to light and accommodation.  Extraocular movements intact.  Nose and throat are clear. NECK/LYMPHATICS:  Supple without thyromegaly or lymphadenopathy.   RESPIRATORY:  Chest wall is within normal limits.   Clear to auscultation and percussion.   CARDIAC:  Regular rate and rhythm, normal S1 and S2 without murmurs, rubs or gallops.   VASCULAR:  Distal pulses 2+.   GASTROINTESTINAL  Soft. No organomegaly or tenderness found.    MUSCULOSKELETAL:  No cyanosis, clubbing or edema noted.   NEUROLOGIC:  The patient is alert and oriented x 3.  No focal deficits.       A/P   Diagnoses and all orders for this visit:  Essential hypertension - uncontrolled -     amLODIPine (NORVASC) 5 MG tablet; Take 1 tablet (5 mg total) by mouth once daily -     CARD US  renal artery duplex; Future  Hyperlipidemia, mixed - stable  Bilateral low back pain without  sciatica, unspecified chronicity - uncontrolled  Rx for increased dose of amlodipine to 5 mg daily Continue current medications otherwise Most recent labs reviewed with patient Await renal artery ultrasound  Return for As Scheduled.

## 2024-03-10 ENCOUNTER — Other Ambulatory Visit: Payer: Self-pay | Admitting: Family Medicine

## 2024-03-10 DIAGNOSIS — I10 Essential (primary) hypertension: Secondary | ICD-10-CM

## 2024-03-14 ENCOUNTER — Ambulatory Visit
Admission: RE | Admit: 2024-03-14 | Discharge: 2024-03-14 | Disposition: A | Discharge status: Home / Self Care | Source: Ambulatory Visit | Attending: Family Medicine | Admitting: Family Medicine

## 2024-03-14 DIAGNOSIS — I10 Essential (primary) hypertension: Secondary | ICD-10-CM | POA: Insufficient documentation

## 2024-03-15 ENCOUNTER — Telehealth: Payer: Self-pay

## 2024-03-15 NOTE — Telephone Encounter (Addendum)
 I spoke with Rexene, RN for Dr Jeffie. They are placing a STAT referral to vein & vascular to hopefully get him seen before he has surgery due to narrowing in right renal artery. Rexene has been trying to reach them for the past couple of days, but they have been playing phone tag.  I sent a secure chat to Orvin Daring, NP with [facility] with the following information: Hey. Dr Jeffie is trying to send this patient as a STAT referral to you guys for right renal artery stenosis. He is scheduled for lumbar surgery with Dr Clois on 03/23/24. Rexene, Dr Toya nurse, has been trying to call the office and has left several messages and keeps playing phone tag. Is there someone I can send a secure chat to help facilitate this? The patient is aware of the need for the referral, and the wife is the best person to contact. Rexene is entering the referral into proficient.  Per response from Orvin Daring, NP: This wouldn't stop him from having surgery and actually it would be in his best interest have that surgery first before we did anything. We will have to place him on blood thinners after and it may actually delay his surgery.   Rexene discussed Fallon's response with Dr Jeffie. However, Dr Jeffie did not feel comfortable clearing him without a formal evaluation by vascular, even if that means we need to postpone his back surgery for his safety.  Dorise Pereyra, NP & Dr Clois were notified of the above.  Per Rexene, the patient is aware of the need for an evaluation by vascular in order to have surgery.

## 2024-03-16 ENCOUNTER — Encounter
Admission: RE | Admit: 2024-03-16 | Discharge: 2024-03-16 | Disposition: A | Discharge status: Home / Self Care | Source: Ambulatory Visit | Attending: Neurosurgery | Admitting: Neurosurgery

## 2024-03-16 ENCOUNTER — Other Ambulatory Visit: Payer: Self-pay

## 2024-03-16 VITALS — BP 194/94 | HR 103 | Temp 98.2°F | Resp 18 | Ht 66.0 in | Wt 227.0 lb

## 2024-03-16 DIAGNOSIS — E785 Hyperlipidemia, unspecified: Secondary | ICD-10-CM | POA: Diagnosis not present

## 2024-03-16 DIAGNOSIS — I10 Essential (primary) hypertension: Secondary | ICD-10-CM | POA: Diagnosis not present

## 2024-03-16 DIAGNOSIS — R079 Chest pain, unspecified: Secondary | ICD-10-CM | POA: Diagnosis not present

## 2024-03-16 DIAGNOSIS — Z01812 Encounter for preprocedural laboratory examination: Secondary | ICD-10-CM

## 2024-03-16 DIAGNOSIS — R0609 Other forms of dyspnea: Secondary | ICD-10-CM

## 2024-03-16 DIAGNOSIS — M48062 Spinal stenosis, lumbar region with neurogenic claudication: Secondary | ICD-10-CM

## 2024-03-16 DIAGNOSIS — Z01818 Encounter for other preprocedural examination: Secondary | ICD-10-CM | POA: Diagnosis present

## 2024-03-16 DIAGNOSIS — Z01810 Encounter for preprocedural cardiovascular examination: Secondary | ICD-10-CM

## 2024-03-16 HISTORY — DX: Dyspnea, unspecified: R06.00

## 2024-03-16 LAB — CBC
HCT: 47.2 % (ref 39.0–52.0)
Hemoglobin: 16.1 g/dL (ref 13.0–17.0)
MCH: 29.9 pg (ref 26.0–34.0)
MCHC: 34.1 g/dL (ref 30.0–36.0)
MCV: 87.7 fL (ref 80.0–100.0)
Platelets: 275 K/uL (ref 150–400)
RBC: 5.38 MIL/uL (ref 4.22–5.81)
RDW: 12.8 % (ref 11.5–15.5)
WBC: 10.8 K/uL — ABNORMAL HIGH (ref 4.0–10.5)
nRBC: 0 % (ref 0.0–0.2)

## 2024-03-16 LAB — BASIC METABOLIC PANEL WITH GFR
Anion gap: 12 (ref 5–15)
BUN: 12 mg/dL (ref 8–23)
CO2: 24 mmol/L (ref 22–32)
Calcium: 10 mg/dL (ref 8.9–10.3)
Chloride: 103 mmol/L (ref 98–111)
Creatinine, Ser: 0.85 mg/dL (ref 0.61–1.24)
GFR, Estimated: >60 mL/min
Glucose, Bld: 108 mg/dL — ABNORMAL HIGH (ref 70–99)
Potassium: 3.5 mmol/L (ref 3.5–5.1)
Sodium: 138 mmol/L (ref 135–145)

## 2024-03-16 LAB — URINALYSIS, COMPLETE (UACMP) WITH MICROSCOPIC
Bacteria, UA: NONE SEEN
Bilirubin Urine: NEGATIVE
Glucose, UA: NEGATIVE mg/dL
Hgb urine dipstick: NEGATIVE
Ketones, ur: NEGATIVE mg/dL
Leukocytes,Ua: NEGATIVE
Nitrite: NEGATIVE
Protein, ur: NEGATIVE mg/dL
Specific Gravity, Urine: 1.012 (ref 1.005–1.030)
Squamous Epithelial / HPF: 0 /HPF (ref 0–5)
pH: 6 (ref 5.0–8.0)

## 2024-03-16 LAB — TYPE AND SCREEN
ABO/RH(D): O POS
Antibody Screen: NEGATIVE

## 2024-03-16 LAB — SURGICAL PCR SCREEN
MRSA, PCR: NEGATIVE
Staphylococcus aureus: NEGATIVE

## 2024-03-16 NOTE — Patient Instructions (Addendum)
 Your procedure is scheduled on:  [facility] JANUARY 28  Report to the Registration Desk on the 1st floor of the [facility] To find out your arrival time, please call [phone] between 1PM - 3PM on:  TUESDAY  JANUARY 27  If your arrival time is 6:00 am, do not arrive before that time as the [facility] entrance doors do not open until 6:00 am.  REMEMBER: Instructions that are not followed completely may result in serious medical risk, up to and including death; or upon the discretion of your surgeon and anesthesiologist your surgery may need to be rescheduled.  Do not eat food after midnight the night before surgery.  No gum chewing or hard candies.  You may however, drink CLEAR liquids up to 2 hours before you are scheduled to arrive for your surgery. Do not drink anything within 2 hours of your scheduled arrival time.  Clear liquids include: - water  - apple juice without pulp - gatorade (not RED colors) - black coffee or tea (Do NOT add milk or creamers to the coffee or tea) Do NOT drink anything that is not on this list.  One week prior to surgery: Stop Anti-inflammatories (NSAIDS) such as Advil , Aleve , Ibuprofen , Motrin , Naproxen , Naprosyn  and Aspirin based products such as Excedrin, Goody's Powder, BC Powder. Stop ANY OVER THE COUNTER supplements until after surgery.  You may however, continue to take Tylenol  if needed for pain up until the day of surgery.   Continue taking all of your other prescription medications up until the day of surgery.  ON THE DAY OF SURGERY ONLY TAKE THESE MEDICATIONS WITH SIPS OF WATER:  gabapentin  (NEURONTIN )   No Alcohol for 24 hours before or after surgery.  Do not use any recreational drugs for at least a week (preferably 2 weeks) before your surgery.  Please be advised that the combination of cocaine and anesthesia may have negative outcomes, up to and including death. If you test positive for cocaine, your surgery will be  cancelled.  On the morning of surgery brush your teeth with toothpaste and water, you may rinse your mouth with mouthwash if you wish. Do not swallow any toothpaste or mouthwash.  Use CHG Soap as directed on instruction sheet.  Do not wear jewelry, make-up, hairpins, clips or nail polish.  For welded (permanent) jewelry: bracelets, anklets, waist bands, etc.  Please have this removed prior to surgery.  If it is not removed, there is a chance that hospital personnel will need to cut it off on the day of surgery.  Do not wear lotions, powders, or perfumes.   Do not shave body hair from the neck down 48 hours before surgery.  Contact lenses, hearing aids and dentures may not be worn into surgery.  Do not bring valuables to the hospital. [facility] is not responsible for any missing/lost belongings or valuables.   Notify your doctor if there is any change in your medical condition (cold, fever, infection).  Wear comfortable clothing (specific to your surgery type) to the hospital.  After surgery, you can help prevent lung complications by doing breathing exercises.  Take deep breaths and cough every 1-2 hours.   If you are being discharged the day of surgery, you will not be allowed to drive home. You will need a responsible individual to drive you home and stay with you for 24 hours after surgery.   If you are taking public transportation, you will need to have a responsible individual with you.  Please call the Pre-admissions Testing Dept. at [phone] if you have any questions about these instructions.  Surgery Visitation Policy:  Patients having surgery or a procedure may have two visitors.  Children under the age of 53 must have an adult with them who is not the patient.  Merchandiser, Retail to address health-related social needs:  [email or web address]    Pre-operative 4 CHG Bath Instructions   You can play a key role in reducing the risk of  infection after surgery. Your skin needs to be as free of germs as possible. You can reduce the number of germs on your skin by washing with CHG (chlorhexidine  gluconate) soap before surgery. CHG is an antiseptic soap that kills germs and continues to kill germs even after washing.   DO NOT use if you have an allergy to chlorhexidine /CHG or antibacterial soaps. If your skin becomes reddened or irritated, stop using the CHG and notify one of our RNs at [phone].   Please shower with the CHG soap starting 4 days before surgery using the following schedule:   STARTING SATURDAY JANUARY 24     Please keep in mind the following:  DO NOT shave, including legs and underarms, starting the day of your first shower.   You may shave your face at any point before/day of surgery.  Place clean sheets on your bed the day you start using CHG soap. Use a clean washcloth (not used since being washed) for each shower. DO NOT sleep with pets once you start using the CHG.   CHG Shower Instructions:  If you choose to wash your hair and private area, wash first with your normal shampoo/soap.  After you use shampoo/soap, rinse your hair and body thoroughly to remove shampoo/soap residue.  Turn the water OFF and apply about 3 tablespoons (45 ml) of CHG soap to a CLEAN washcloth.  Apply CHG soap ONLY FROM YOUR NECK DOWN TO YOUR TOES (washing for 3-5 minutes)  DO NOT use CHG soap on face, private areas, open wounds, or sores.  Pay special attention to the area where your surgery is being performed.  If you are having back surgery, having someone wash your back for you may be helpful. Wait 2 minutes after CHG soap is applied, then you may rinse off the CHG soap.  Pat dry with a clean towel  Put on clean clothes/pajamas   If you choose to wear lotion, please use ONLY the CHG-compatible lotions on the back of this paper.     Additional instructions for the day of surgery: DO NOT APPLY any lotions, deodorants,  cologne, or perfumes.   Put on clean/comfortable clothes.  Brush your teeth.  Ask your nurse before applying any prescription medications to the skin.      CHG Compatible Lotions   Aveeno Moisturizing lotion  Cetaphil Moisturizing Cream  Cetaphil Moisturizing Lotion  Clairol Herbal Essence Moisturizing Lotion, Dry Skin  Clairol Herbal Essence Moisturizing Lotion, Extra Dry Skin  Clairol Herbal Essence Moisturizing Lotion, Normal Skin  Curel Age Defying Therapeutic Moisturizing Lotion with Alpha Hydroxy  Curel Extreme Care Body Lotion  Curel Soothing Hands Moisturizing Hand Lotion  Curel Therapeutic Moisturizing Cream, Fragrance-Free  Curel Therapeutic Moisturizing Lotion, Fragrance-Free  Curel Therapeutic Moisturizing Lotion, Original Formula  Eucerin Daily Replenishing Lotion  Eucerin Dry Skin Therapy Plus Alpha Hydroxy Crme  Eucerin Dry Skin Therapy Plus Alpha Hydroxy Lotion  Eucerin Original Crme  Eucerin Original Lotion  Eucerin Plus Crme Eucerin Plus Lotion  Eucerin TriLipid  Replenishing Lotion  Keri Anti-Bacterial Hand Lotion  Keri Deep Conditioning Original Lotion Dry Skin Formula Softly Scented  Keri Deep Conditioning Original Lotion, Fragrance Free Sensitive Skin Formula  Keri Lotion Fast Absorbing Fragrance Free Sensitive Skin Formula  Keri Lotion Fast Absorbing Softly Scented Dry Skin Formula  Keri Original Lotion  Keri Skin Renewal Lotion Keri Silky Smooth Lotion  Keri Silky Smooth Sensitive Skin Lotion  Nivea Body Creamy Conditioning Oil  Nivea Body Extra Enriched Teacher, Adult Education Moisturizing Lotion Nivea Crme  Nivea Skin Firming Lotion  NutraDerm 30 Skin Lotion  NutraDerm Skin Lotion  NutraDerm Therapeutic Skin Cream  NutraDerm Therapeutic Skin Lotion  ProShield Protective Hand Cream  Provon moisturizing lotion

## 2024-03-16 NOTE — Telephone Encounter (Signed)
 Ray Saunders is scheduled to see vascular on 03/18/24.

## 2024-03-17 NOTE — Telephone Encounter (Signed)
 Please see below message from the answering service.    Media Information       Document Information  Misc Clinical: AMB Correspondence  NEUROSURGERY DISABILITY FORMS  03/16/2024 15:29  Attached To:  Debby FORBES Ades  Source Information  Default, Provider, MD

## 2024-03-17 NOTE — Telephone Encounter (Signed)
 I spoke to Rexene this morning. She just wanted to know when his vascular appt was

## 2024-03-17 NOTE — Telephone Encounter (Signed)
 Disregard previous attachment. Wrong attachment chosen in error. Here is the message from the answering service.     Media Information  Document Information  Misc Clinical: AMB Correspondence  NEUROSURGERY ANSWERING SERVICE CALL  03/16/2024 11:50  Attached To:  Debby FORBES Ades  Source Information  Default, Provider, MD

## 2024-03-18 ENCOUNTER — Ambulatory Visit (INDEPENDENT_AMBULATORY_CARE_PROVIDER_SITE_OTHER): Payer: Self-pay | Admitting: Nurse Practitioner

## 2024-03-18 ENCOUNTER — Encounter (INDEPENDENT_AMBULATORY_CARE_PROVIDER_SITE_OTHER): Payer: Self-pay | Admitting: Nurse Practitioner

## 2024-03-18 VITALS — BP 147/98 | HR 97 | Resp 17 | Ht 66.0 in | Wt 223.4 lb

## 2024-03-18 DIAGNOSIS — E785 Hyperlipidemia, unspecified: Secondary | ICD-10-CM | POA: Diagnosis not present

## 2024-03-18 DIAGNOSIS — I701 Atherosclerosis of renal artery: Secondary | ICD-10-CM

## 2024-03-18 NOTE — Progress Notes (Signed)
 Ray Saunders is a 64 y.o. male that comes today for the following problem(s):   Chief Complaint  Patient presents with   consultation back surgery    HPI: Patient in the office for recheck of blood pressure prior to clearing him for back surgery.  He notes his blood pressures have been significantly better when checked earlier in the day before he becomes very active.  He notes the more active he gets the more pain he will have.  With the increased pain he has noted increased blood pressures.  He continues to take his amlodipine 5 mg and lisinopril-hydrochlorothiazide 20-12.5 mg regularly.  He denies headaches, vision changes, speech problems, focal neurologic symptoms, chest pain, shortness of breath or palpitations.  He was found to have renal artery stenosis on the right with ultrasound and he has an appointment with vascular surgery today for further evaluation.  He will need them to clear him for surgery as well.  He remains on his rosuvastatin regularly for the control of his cholesterol.   Goals      Maintain health/healthy lifestyle        Patient Active Problem List  Diagnosis   Hyperlipidemia   Elevated PSA   Hypertension   Chest pain at rest   Dyspnea on exertion   Lumbar spondylosis     Past Medical History:  Diagnosis Date   Elevated PSA    Esophageal reflux    H/O alcohol abuse    sober since 1996   Hyperlipidemia    Hyperplastic colon polyp 12/17/09   Hypertension    Kidney stone 10/12/2014   Tubular adenoma of colon 12/17/09     Past Surgical History:  Procedure Laterality Date   COLONOSCOPY  12/17/09   COLONOSCOPY  10/09/2014   Negative biopsy/PHx of polyps/Repeat 53yrs/MUS    Social History   Socioeconomic History   Marital status: Married   Number of children: 1  Occupational History   Occupation: Rock solicitor  Tobacco Use   Smoking status: Former    Current packs/day: 0.00    Average packs/day: 2.0 packs/day for 20.0  years (40.0 ttl pk-yrs)    Types: Cigarettes    Start date: 02/25/1967    Quit date: 02/25/1987    Years since quitting: 37.0   Smokeless tobacco: Never  Vaping Use   Vaping status: Never Used  Substance and Sexual Activity   Alcohol use: No    Alcohol/week: 0.0 standard drinks of alcohol    Comment: Sober since 1996   Drug use: No   Sexual activity: Yes    Partners: Female   Social Drivers of Corporate Investment Banker Strain: Low Risk  (12/30/2023)   Overall Financial Resource Strain (CARDIA)    Difficulty of Paying Living Expenses: Not hard at all  Food Insecurity: No Food Insecurity (12/30/2023)   Hunger Vital Sign    Worried About Programme Researcher, Broadcasting/film/video in the Last Year: Never true    Ran Out of Food in the Last Year: Never true  Transportation Needs: No Transportation Needs (12/30/2023)   PRAPARE - Administrator, Civil Service (Medical): No    Lack of Transportation (Non-Medical): No  Housing Stability: Low Risk  (12/30/2023)   Housing Stability Vital Sign    Unable to Pay for Housing in the Last Year: No    Number of Times Moved in the Last Year: 0    Homeless in the Last Year: No    Family History  Problem Relation Name Age of Onset   Alcohol abuse Father     ESRD-Transplant Mother     No Known Problems Sister       No Known Allergies  Prior to Admission medications  Medication Sig Taking? Last Dose  amLODIPine (NORVASC) 5 MG tablet Take 1 tablet (5 mg total) by mouth once daily Yes Taking  fluticasone  propionate (FLONASE ) 50 mcg/actuation nasal spray PLACE 1 SPRAY INTO BOTH NOSTRILS 2 (TWO) TIMES DAILY Yes Taking  gabapentin  (NEURONTIN ) 300 MG capsule TAKE 1 CAPSULE BY MOUTH AT BEDTIME FOR 4 DAYS. 1 CAP TWICE A DAY X 4 DAYS. THEN 1 CAP 3 TIMES A DAY Yes Taking  gabapentin  (NEURONTIN ) 600 MG tablet Take 600 mg by mouth 3 (three) times daily Yes Taking  lisinopriL-hydroCHLOROthiazide (ZESTORETIC) 20-12.5 mg tablet TAKE 1 TABLET BY MOUTH  EVERY DAY Patient taking differently: Take 1 tablet by mouth every morning Yes Taking  rosuvastatin (CRESTOR) 5 MG tablet TAKE 1 TABLET BY MOUTH EVERY DAY Yes Taking  tadalafiL (CIALIS) 10 MG tablet TAKE 1 TABLET BY MOUTH ONCE DAILY FOR ERECTIRLE DYSFUNCTION FOR UP TO 30 DAYS. TAKE 30-44 MINS PRIOR TO ANTICIPATED SEXUAL ACTIVITY Yes Taking  tiZANidine (ZANAFLEX) 4 MG tablet TAKE 1/2-1 TABLET BY MOUTH THREE TIMES A DAY AS NEEDED Patient taking differently: at bedtime TAKE 1/2-1 TABLET BY MOUTH THREE TIMES A DAY AS NEEDED Yes Taking  ibuprofen  (MOTRIN ) 200 MG tablet Take 200 mg by mouth every 6 (six) hours as needed for Pain Patient not taking: Reported on 03/18/2024  Not Taking     Objective:  BP 130/88 (BP Location: Left upper arm, Patient Position: Sitting, BP Cuff Size: Large Adult)   Pulse 95   Ht 167.6 cm (5' 6)   Wt (!) 102.1 kg (225 lb)   SpO2 96%   BMI 36.32 kg/m   Physical Examination:  GENERAL:  The patient is alert, oriented and in no acute distress.  HEENT:  Head is normocephalic/atraumatic.  Pupils equal, round and reactive to light and accommodation.  Extraocular movements intact.  Nose and throat are clear. NECK/LYMPHATICS:  Supple without thyromegaly or lymphadenopathy.   RESPIRATORY:  Chest wall is within normal limits.   Clear to auscultation and percussion.   CARDIAC:  Regular rate and rhythm, normal S1 and S2 without murmurs, rubs or gallops.   VASCULAR:  Distal pulses 2+.   GASTROINTESTINAL  Soft. No organomegaly or tenderness found.    MUSCULOSKELETAL:  No cyanosis, clubbing or edema noted.   NEUROLOGIC:  The patient is alert and oriented x 3.  No focal deficits.       A/P   Diagnoses and all orders for this visit:  Essential hypertension - improved  Renal artery stenosis - new  Depression screening  Continue current medications Reviewed most recent labs with patient Continue to follow with specialist, most recent neurosurgery note reviewed Keep  appointment with vascular surgery today.  We will need them to provide clearance for him to move forward with his back surgery. He is cleared medically for surgery otherwise.  No follow-ups on file.

## 2024-03-18 NOTE — Progress Notes (Signed)
 "  Subjective:    Patient ID: Ray Saunders, male    DOB: Mar 26, 1960, 64 y.o.   MRN: [id number] Chief Complaint  Patient presents with   New Patient (Initial Visit)    STAT. np. Consult Vascular eval per Dr. Jeffie ok per FB (Per secure Chat)     HPI  Discussed the use of AI scribe software for clinical note transcription with the patient, who gave verbal consent to proceed.  History of Present Illness Ray AUSMUS is a 64 year old male with renovascular hypertension who presents for vascular surgery consultation regarding renal artery stenosis prior to planned back surgery.  Current antihypertensive therapy includes amlodipine 5 mg, lisinopril 20 mg, and hydrochlorothiazide 12.5 mg daily. He monitors his blood pressure daily and maintains a log, with recent readings showing improvement. He previously required an increase in amlodipine dose.  Severe back pain has persisted for nearly seven months, with progressive worsening and significant functional limitation. Pain is described as debilitating, at times causing him to be unable to do anything but sit, and has resulted in multiple episodes of falling. He has undergone physical therapy and received injections prior to approval for surgery, and is scheduled for back surgery in five days. He attributes elevated blood pressure in part to pain severity, noting that pain exacerbates his hypertension.  He experiences anxiety during clinic visits, which he believes may contribute to transient elevations in blood pressure. He prefers home blood pressure readings for accuracy and consistency. He quit smoking in 1990.    Results Radiology Renal artery ultrasound: Renal artery stenosis suspicious for significant stenosis in right renal artery at outside facility    Review of Systems  Musculoskeletal:  Positive for arthralgias and back pain.  Psychiatric/Behavioral:  The patient is nervous/anxious.   All other systems reviewed and are  negative.      Objective:   Physical Exam Vitals reviewed.  HENT:     Head: Normocephalic.  Cardiovascular:     Rate and Rhythm: Normal rate.     Pulses: Normal pulses.  Pulmonary:     Effort: Pulmonary effort is normal.  Skin:    General: Skin is warm and dry.  Neurological:     Mental Status: He is alert and oriented to person, place, and time.     Gait: Gait abnormal.  Psychiatric:        Mood and Affect: Mood normal.        Behavior: Behavior normal.        Thought Content: Thought content normal.        Judgment: Judgment normal.     Physical Exam    BP (!) 147/98   Pulse 97   Resp 17   Ht 5' 6 (1.676 m)   Wt 223 lb 6.4 oz (101.3 kg)   BMI 36.06 kg/m   Past Medical History:  Diagnosis Date   Alcohol abuse    Cancer (HCC)    PROSTATE   Dyspnea    Elevated PSA    Elevated PSA    GERD (gastroesophageal reflux disease)    History of kidney stones    HLD (hyperlipidemia)    Hypertension     Social History   Socioeconomic History   Marital status: Married    Spouse name: Majoria   Number of children: Not on file   Years of education: Not on file   Highest education level: Not on file  Occupational History   Not on file  Tobacco  Use   Smoking status: Former    Current packs/day: 0.00    Types: Cigarettes    Quit date: 08/24/1987    Years since quitting: 36.5   Smokeless tobacco: Never  Vaping Use   Vaping status: Never Used  Substance and Sexual Activity   Alcohol use: No    Alcohol/week: 0.0 standard drinks of alcohol   Drug use: No   Sexual activity: Not on file  Other Topics Concern   Not on file  Social History Narrative   Not on file   Social Drivers of Health   Tobacco Use: Medium Risk (03/18/2024)   Patient History    Smoking Tobacco Use: Former    Smokeless Tobacco Use: Never    Passive Exposure: Not on file  Financial Resource Strain: Low Risk  (12/30/2023)   Received from [facility]   Overall Financial  Resource Strain (CARDIA)    Difficulty of Paying Living Expenses: Not hard at all  Food Insecurity: No Food Insecurity (12/30/2023)   Received from [facility]   Epic    Within the past 12 months, you worried that your food would run out before you got the money to buy more.: Never true    Within the past 12 months, the food you bought just didn't last and you didn't have money to get more.: Never true  Transportation Needs: No Transportation Needs (12/30/2023)   Received from [facility] - Transportation    In the past 12 months, has lack of transportation kept you from medical appointments or from getting medications?: No    Lack of Transportation (Non-Medical): No  Physical Activity: Not on file  Stress: Not on file  Social Connections: Not on file  Intimate Partner Violence: Not on file  Depression (EYV7-0): Not on file  Alcohol Screen: Not on file  Housing: Low Risk  (12/30/2023)   Received from [facility]   Epic    In the last 12 months, was there a time when you were not able to pay the mortgage or rent on time?: No    In the past 12 months, how many times have you moved where you were living?: 0    At any time in the past 12 months, were you homeless or living in a shelter (including now)?: No  Utilities: Not At Risk (12/30/2023)   Received from [facility]   Epic    In the past 12 months has the electric, gas, oil, or water company threatened to shut off services in your home?: No  Health Literacy: Not on file    Past Surgical History:  Procedure Laterality Date   COLONOSCOPY WITH PROPOFOL  N/A 10/09/2014   Procedure: COLONOSCOPY WITH PROPOFOL ;  Surgeon: Gladis RAYMOND Mariner, MD;  Location: [facility] ENDOSCOPY;  Service: Endoscopy;  Laterality: N/A;   radiation   05/2020   prostate   RADIOACTIVE SEED IMPLANT N/A 07/02/2020   Procedure: RADIOACTIVE SEED IMPLANT/BRACHYTHERAPY IMPLANT;  Surgeon: Penne Knee, MD;  Location: [facility] ORS;  Service: Urology;  Laterality: N/A;  removal of bladder stone    Family History  Problem Relation Age of Onset   Kidney failure Mother        transplant   Prostate cancer Maternal Grandfather    Bladder Cancer Neg Hx     Allergies[1]     Latest Ref Rng & Units 03/16/2024   10:52 AM 06/28/2020    8:23 AM  07/07/2019    9:25 AM  CBC  WBC 4.0 - 10.5 K/uL 10.8  6.7  8.7   Hemoglobin 13.0 - 17.0 g/dL 83.8  82.5  83.2   Hematocrit 39.0 - 52.0 % 47.2  48.8  48.5   Platelets 150 - 400 K/uL 275  289  248       CMP     Component Value Date/Time   NA 138 03/16/2024 1052   K 3.5 03/16/2024 1052   CL 103 03/16/2024 1052   CO2 24 03/16/2024 1052   GLUCOSE 108 (H) 03/16/2024 1052   BUN 12 03/16/2024 1052   BUN 15 03/31/2016 1412   CREATININE 0.85 03/16/2024 1052   CALCIUM 10.0 03/16/2024 1052   PROT 8.0 07/07/2019 0925   ALBUMIN 4.5 07/07/2019 0925   AST 25 07/07/2019 0925   ALT 29 07/07/2019 0925   ALKPHOS 63 07/07/2019 0925   BILITOT 0.9 07/07/2019 0925   GFRNONAA >60 03/16/2024 1052     No results found.     Assessment & Plan:   1. Renal artery stenosis (Primary) Renal artery stenosis Renal artery stenosis with controlled hypertension on current medication regimen. Intervention deferred due to upcoming back surgery and antiplatelet therapy requirements. Pain may cause transient blood pressure elevations. Risk factors include family history, prior tobacco use, age, and atherosclerosis. - Deferred intervention until after back surgery and recovery. - Instructed him to continue home blood pressure monitoring and logging. - Planned repeat renal artery imaging after surgical recovery to reassess severity and need for intervention. - Documented surgical clearance and communicated vascular status to surgical team. - Will re-evaluate for possible angiography and stenting if hypertension remains uncontrolled on maximal medical therapy after pain is  addressed. - VAS US  RENAL ARTERY DUPLEX; Future  2. Hyperlipidemia, unspecified hyperlipidemia type Continue statin as ordered and reviewed, no changes at this time   Assessment and Plan Assessment & Plan      Medications Ordered Prior to Encounter[2]  There are no Patient Instructions on file for this visit. Return in about 3 months (around 06/16/2024) for renal artery duplex Jd/FB.   Finnlee Guarnieri E Brytni Dray, NP      [1] No Known Allergies [2]  Current Outpatient Medications on File Prior to Visit  Medication Sig Dispense Refill   amLODipine (NORVASC) 5 MG tablet Take 5 mg by mouth at bedtime.     fluticasone  (FLONASE ) 50 MCG/ACT nasal spray Place 1 spray into both nostrils daily as needed for allergies or rhinitis.     gabapentin  (NEURONTIN ) 600 MG tablet Take 1 tablet (600 mg total) by mouth 3 (three) times daily. (Patient taking differently: Take 300 mg by mouth 3 (three) times daily.) 90 tablet 0   ibuprofen  (ADVIL ) 200 MG tablet Take 200 mg by mouth every 4 (four) hours as needed.     lisinopril-hydrochlorothiazide (PRINZIDE,ZESTORETIC) 20-12.5 MG tablet Take 1 tablet by mouth in the morning.  2   rosuvastatin (CRESTOR) 5 MG tablet Take 5 mg by mouth at bedtime.     tiZANidine (ZANAFLEX) 4 MG tablet Take 4 mg by mouth every 6 (six) hours as needed for muscle spasms.     ibuprofen  (ADVIL ) 600 MG tablet Take 1 tablet (600 mg total) by mouth every 6 (six) hours as needed. (Patient taking differently: Take 600 mg by mouth daily.) 20 tablet 0   mupirocin  ointment (BACTROBAN ) 2 % Apply 1 Application topically 2 (two) times daily. (Patient not taking: Reported on 03/11/2024) 22 g 0  No current facility-administered medications on file prior to visit.   "

## 2024-03-22 MED ORDER — CHLORHEXIDINE GLUCONATE 0.12 % MT SOLN
15.0000 mL | Freq: Once | OROMUCOSAL | Status: AC
  Administered 2024-03-23: 15 mL via OROMUCOSAL

## 2024-03-22 MED ORDER — LACTATED RINGERS IV SOLN: INTRAVENOUS | Status: DC

## 2024-03-22 MED ORDER — ORAL CARE MOUTH RINSE: 15.0000 mL | Freq: Once | OROMUCOSAL | Status: AC

## 2024-03-23 ENCOUNTER — Ambulatory Visit: Payer: Self-pay | Admitting: Urgent Care

## 2024-03-23 ENCOUNTER — Ambulatory Visit
Admission: RE | Admit: 2024-03-23 | Discharge: 2024-03-23 | Disposition: A | Discharge status: Home / Self Care | Attending: Neurosurgery | Admitting: Neurosurgery

## 2024-03-23 ENCOUNTER — Other Ambulatory Visit (HOSPITAL_COMMUNITY): Payer: Self-pay

## 2024-03-23 ENCOUNTER — Other Ambulatory Visit: Payer: Self-pay

## 2024-03-23 ENCOUNTER — Encounter: Payer: Self-pay | Admitting: Neurosurgery

## 2024-03-23 ENCOUNTER — Ambulatory Visit

## 2024-03-23 ENCOUNTER — Telehealth (HOSPITAL_COMMUNITY): Payer: Self-pay

## 2024-03-23 ENCOUNTER — Encounter
Admission: RE | Disposition: A | Discharge status: Home / Self Care | Payer: Self-pay | Source: Home / Self Care | Attending: Neurosurgery

## 2024-03-23 DIAGNOSIS — M48062 Spinal stenosis, lumbar region with neurogenic claudication: Secondary | ICD-10-CM | POA: Diagnosis not present

## 2024-03-23 DIAGNOSIS — I10 Essential (primary) hypertension: Secondary | ICD-10-CM | POA: Insufficient documentation

## 2024-03-23 DIAGNOSIS — R079 Chest pain, unspecified: Secondary | ICD-10-CM

## 2024-03-23 DIAGNOSIS — Z01812 Encounter for preprocedural laboratory examination: Secondary | ICD-10-CM

## 2024-03-23 DIAGNOSIS — E785 Hyperlipidemia, unspecified: Secondary | ICD-10-CM | POA: Insufficient documentation

## 2024-03-23 DIAGNOSIS — Z87891 Personal history of nicotine dependence: Secondary | ICD-10-CM | POA: Insufficient documentation

## 2024-03-23 DIAGNOSIS — Z79899 Other long term (current) drug therapy: Secondary | ICD-10-CM | POA: Diagnosis not present

## 2024-03-23 DIAGNOSIS — K219 Gastro-esophageal reflux disease without esophagitis: Secondary | ICD-10-CM | POA: Insufficient documentation

## 2024-03-23 DIAGNOSIS — Z01810 Encounter for preprocedural cardiovascular examination: Secondary | ICD-10-CM

## 2024-03-23 DIAGNOSIS — M5126 Other intervertebral disc displacement, lumbar region: Secondary | ICD-10-CM | POA: Insufficient documentation

## 2024-03-23 DIAGNOSIS — M48061 Spinal stenosis, lumbar region without neurogenic claudication: Secondary | ICD-10-CM

## 2024-03-23 DIAGNOSIS — Z01818 Encounter for other preprocedural examination: Secondary | ICD-10-CM

## 2024-03-23 LAB — ABO/RH: ABO/RH(D): O POS

## 2024-03-23 MED ORDER — VASOPRESSIN 20 UNIT/ML IV SOLN
INTRAVENOUS | Status: DC | PRN
  Administered 2024-03-23 (×3): 2 [IU] via INTRAVENOUS

## 2024-03-23 MED ORDER — OXYCODONE HCL 5 MG PO TABS: 5.0000 mg | ORAL_TABLET | Freq: Once | ORAL | Status: DC | PRN

## 2024-03-23 MED ORDER — LIDOCAINE HCL (CARDIAC) PF 100 MG/5ML IV SOSY
PREFILLED_SYRINGE | INTRAVENOUS | Status: DC | PRN
  Administered 2024-03-23: 40 mg via INTRAVENOUS
  Administered 2024-03-23: 60 mg via INTRAVENOUS

## 2024-03-23 MED ORDER — ONDANSETRON HCL 4 MG/2ML IJ SOLN: 4.0000 mg | Freq: Once | INTRAMUSCULAR | Status: DC | PRN

## 2024-03-23 MED ORDER — FIBRIN SEALANT 2 ML SINGLE DOSE KIT
PACK | CUTANEOUS | Status: DC | PRN
  Administered 2024-03-23: 2 mL via TOPICAL

## 2024-03-23 MED ORDER — KETAMINE HCL 50 MG/5ML IJ SOSY
PREFILLED_SYRINGE | INTRAMUSCULAR | Status: AC
  Filled 2024-03-23: qty 5

## 2024-03-23 MED ORDER — KETAMINE HCL 50 MG/5ML IJ SOSY
PREFILLED_SYRINGE | INTRAMUSCULAR | Status: DC | PRN
  Administered 2024-03-23 (×2): 20 mg via INTRAVENOUS
  Administered 2024-03-23: 10 mg via INTRAVENOUS

## 2024-03-23 MED ORDER — ALBUTEROL SULFATE (2.5 MG/3ML) 0.083% IN NEBU: 2.5000 mg | INHALATION_SOLUTION | RESPIRATORY_TRACT | Status: DC | PRN

## 2024-03-23 MED ORDER — DEXAMETHASONE SOD PHOSPHATE PF 10 MG/ML IJ SOLN
INTRAMUSCULAR | Status: DC | PRN
  Administered 2024-03-23: 10 mg via INTRAVENOUS

## 2024-03-23 MED ORDER — ACETAMINOPHEN 10 MG/ML IV SOLN: 1000.0000 mg | Freq: Once | INTRAVENOUS | Status: DC | PRN

## 2024-03-23 MED ORDER — ACETAMINOPHEN 10 MG/ML IV SOLN
INTRAVENOUS | Status: DC | PRN
  Administered 2024-03-23: 1000 mg via INTRAVENOUS

## 2024-03-23 MED ORDER — CHLORHEXIDINE GLUCONATE 0.12 % MT SOLN
OROMUCOSAL | Status: AC
  Filled 2024-03-23: qty 15

## 2024-03-23 MED ORDER — PHENYLEPHRINE HCL-NACL 20-0.9 MG/250ML-% IV SOLN
INTRAVENOUS | Status: DC | PRN
  Administered 2024-03-23: 30 ug/min via INTRAVENOUS

## 2024-03-23 MED ORDER — SENNA 8.6 MG PO TABS
1.0000 | ORAL_TABLET | Freq: Two times a day (BID) | ORAL | 0 refills | Status: AC | PRN
  Filled 2024-03-23: qty 30, 15d supply, fill #0

## 2024-03-23 MED ORDER — SURGIFLO WITH THROMBIN (HEMOSTATIC MATRIX KIT) OPTIME
TOPICAL | Status: DC | PRN
  Administered 2024-03-23: 1 via TOPICAL

## 2024-03-23 MED ORDER — HYDROMORPHONE HCL 1 MG/ML IJ SOLN
INTRAMUSCULAR | Status: AC
  Filled 2024-03-23: qty 1

## 2024-03-23 MED ORDER — OXYCODONE HCL 5 MG PO TABS
5.0000 mg | ORAL_TABLET | ORAL | 0 refills | Status: DC | PRN
  Filled 2024-03-23: qty 30, 5d supply, fill #0

## 2024-03-23 MED ORDER — EPHEDRINE SULFATE-NACL 50-0.9 MG/10ML-% IV SOSY
PREFILLED_SYRINGE | INTRAVENOUS | Status: DC | PRN
  Administered 2024-03-23: 5 mg via INTRAVENOUS

## 2024-03-23 MED ORDER — 0.9 % SODIUM CHLORIDE (POUR BTL) OPTIME
TOPICAL | Status: DC | PRN
  Administered 2024-03-23: 500 mL

## 2024-03-23 MED ORDER — MIDAZOLAM HCL 2 MG/2ML IJ SOLN
INTRAMUSCULAR | Status: AC
  Filled 2024-03-23: qty 2

## 2024-03-23 MED ORDER — MIDAZOLAM HCL (PF) 2 MG/2ML IJ SOLN
INTRAMUSCULAR | Status: DC | PRN
  Administered 2024-03-23: 2 mg via INTRAVENOUS

## 2024-03-23 MED ORDER — BUPIVACAINE HCL (PF) 0.5 % IJ SOLN
INTRAMUSCULAR | Status: DC | PRN
  Administered 2024-03-23: 30 mL

## 2024-03-23 MED ORDER — CEFAZOLIN SODIUM-DEXTROSE 2-4 GM/100ML-% IV SOLN
INTRAVENOUS | Status: AC
  Filled 2024-03-23: qty 100

## 2024-03-23 MED ORDER — CEFAZOLIN SODIUM-DEXTROSE 2-4 GM/100ML-% IV SOLN
2.0000 g | Freq: Once | INTRAVENOUS | Status: AC
  Administered 2024-03-23: 2 g via INTRAVENOUS

## 2024-03-23 MED ORDER — ONDANSETRON HCL 4 MG/2ML IJ SOLN
INTRAMUSCULAR | Status: DC | PRN
  Administered 2024-03-23: 4 mg via INTRAVENOUS

## 2024-03-23 MED ORDER — SUCCINYLCHOLINE CHLORIDE 200 MG/10ML IV SOSY
PREFILLED_SYRINGE | INTRAVENOUS | Status: DC | PRN
  Administered 2024-03-23: 100 mg via INTRAVENOUS

## 2024-03-23 MED ORDER — TIZANIDINE HCL 4 MG PO TABS
4.0000 mg | ORAL_TABLET | Freq: Four times a day (QID) | ORAL | 0 refills | Status: AC | PRN
  Filled 2024-03-23: qty 120, 30d supply, fill #0

## 2024-03-23 MED ORDER — PHENYLEPHRINE 80 MCG/ML (10ML) SYRINGE FOR IV PUSH (FOR BLOOD PRESSURE SUPPORT)
PREFILLED_SYRINGE | INTRAVENOUS | Status: DC | PRN
  Administered 2024-03-23: 160 ug via INTRAVENOUS
  Administered 2024-03-23: 80 ug via INTRAVENOUS
  Administered 2024-03-23: 120 ug via INTRAVENOUS

## 2024-03-23 MED ORDER — FENTANYL CITRATE (PF) 100 MCG/2ML IJ SOLN
INTRAMUSCULAR | Status: AC
  Filled 2024-03-23: qty 2

## 2024-03-23 MED ORDER — LACTATED RINGERS IV SOLN: INTRAVENOUS | Status: DC | PRN

## 2024-03-23 MED ORDER — BUPIVACAINE-EPINEPHRINE (PF) 0.5% -1:200000 IJ SOLN
INTRAMUSCULAR | Status: AC
  Filled 2024-03-23: qty 30

## 2024-03-23 MED ORDER — OXYCODONE HCL 5 MG/5ML PO SOLN: 5.0000 mg | Freq: Once | ORAL | Status: DC | PRN

## 2024-03-23 MED ORDER — DEXMEDETOMIDINE HCL IN NACL 80 MCG/20ML IV SOLN
INTRAVENOUS | Status: DC | PRN
  Administered 2024-03-23 (×2): 4 ug via INTRAVENOUS

## 2024-03-23 MED ORDER — KETOROLAC TROMETHAMINE 30 MG/ML IJ SOLN
INTRAMUSCULAR | Status: DC | PRN
  Administered 2024-03-23: 15 mg via INTRAVENOUS

## 2024-03-23 MED ORDER — METHYLPREDNISOLONE ACETATE 40 MG/ML IJ SUSP
INTRAMUSCULAR | Status: AC
  Filled 2024-03-23: qty 1

## 2024-03-23 MED ORDER — BUPIVACAINE HCL (PF) 0.5 % IJ SOLN
INTRAMUSCULAR | Status: AC
  Filled 2024-03-23: qty 30

## 2024-03-23 MED ORDER — BUPIVACAINE-EPINEPHRINE (PF) 0.5% -1:200000 IJ SOLN
INTRAMUSCULAR | Status: DC | PRN
  Administered 2024-03-23: 10 mL via PERINEURAL

## 2024-03-23 MED ORDER — FENTANYL CITRATE (PF) 100 MCG/2ML IJ SOLN
INTRAMUSCULAR | Status: DC | PRN
  Administered 2024-03-23: 100 ug via INTRAVENOUS

## 2024-03-23 MED ORDER — PROPOFOL 10 MG/ML IV BOLUS
INTRAVENOUS | Status: DC | PRN
  Administered 2024-03-23: 200 mg via INTRAVENOUS

## 2024-03-23 MED ORDER — HYDROMORPHONE HCL 1 MG/ML IJ SOLN
INTRAMUSCULAR | Status: DC | PRN
  Administered 2024-03-23: .8 mg via INTRAVENOUS
  Administered 2024-03-23: .2 mg via INTRAVENOUS

## 2024-03-23 MED ORDER — HYDROMORPHONE HCL 1 MG/ML IJ SOLN: 0.2500 mg | INTRAMUSCULAR | Status: DC | PRN

## 2024-03-23 MED ORDER — POLYETHYLENE GLYCOL 3350 17 GM/SCOOP PO POWD
17.0000 g | Freq: Every day | ORAL | 0 refills | Status: AC | PRN
  Filled 2024-03-23: qty 238, 14d supply, fill #0

## 2024-03-23 NOTE — Anesthesia Procedure Notes (Addendum)
 Procedure Name: Intubation Date/Time: 03/23/2024 7:34 AM  Performed by: Trudy Rankin LABOR, CRNAPre-anesthesia Checklist: Patient identified, Emergency Drugs available, Suction available and Patient being monitored Patient Re-evaluated:Patient Re-evaluated prior to induction Oxygen Delivery Method: Circle system utilized Preoxygenation: Pre-oxygenation with 100% oxygen Induction Type: IV induction Ventilation: Mask ventilation without difficulty Laryngoscope Size: McGrath and 4 Grade View: Grade I Tube type: Oral Tube size: 7.5 mm Number of attempts: 1 Airway Equipment and Method: Stylet and Oral airway Placement Confirmation: ETT inserted through vocal cords under direct vision, positive ETCO2 and breath sounds checked- equal and bilateral Secured at: 23 cm Tube secured with: Tape Dental Injury: Teeth and Oropharynx as per pre-operative assessment  Comments: Atraumatic intubation

## 2024-03-23 NOTE — Telephone Encounter (Signed)
 Pharmacy Patient Advocate Encounter  Received notification from [facility] that Prior Authorization for oxyCODONE  HCl 5MG  tablets  has been APPROVED from 03/23/24 to 04/22/24   PA #/Case ID/Reference #: [id number]

## 2024-03-23 NOTE — Transfer of Care (Signed)
 Immediate Anesthesia Transfer of Care Note  Patient: Ray Saunders  Procedure(s) Performed: LUMBAR LAMINECTOMY/DECOMPRESSION MICRODISCECTOMY 1 LEVEL  Patient Location: PACU  Anesthesia Type:General  Level of Consciousness: awake, drowsy, patient cooperative, and responds to stimulation  Airway & Oxygen Therapy: Patient Spontanous Breathing and Patient connected to face mask oxygen  Post-op Assessment: Report given to RN, Post -op Vital signs reviewed and stable, and Patient moving all extremities  Post vital signs: Reviewed and stable  Last Vitals:  Vitals Value Taken Time  BP 142/80 03/23/24 09:37  Temp 36.2 C 03/23/24 09:37  Pulse 78 03/23/24 09:39  Resp 0 03/23/24 09:39  SpO2 100 % 03/23/24 09:39  Vitals shown include unfiled device data.  Last Pain:  Vitals:   03/23/24 0619  TempSrc: Temporal  PainSc: 4       Patients Stated Pain Goal: 2 (03/23/24 9380)  Complications: No notable events documented.

## 2024-03-23 NOTE — Progress Notes (Signed)
 Patient ambulated to the bathroom, voided, and was able to eat graham crackers and drink water. Patient denies nausea.

## 2024-03-23 NOTE — Op Note (Signed)
 Indications: [name] is suffering from lumbar stenosis causing neurogenic claudication (ICD10 M48.062). The patient tried and failed conservative management, prompting surgical intervention.  Findings: severe stenosis, calcification of the ligamentum  Preoperative Diagnosis: Lumbar Stenosis with neurogenic claudication Postoperative Diagnosis: same   EBL: 25 ml IVF:see anesthesia record Drains: none Disposition: Extubated and Stable to PACU Complications: none  No foley catheter was placed.   Preoperative Note:  Risks of surgery discussed include: infection, bleeding, stroke, coma, death, paralysis, CSF leak, nerve/spinal cord injury, numbness, tingling, weakness, complex regional pain syndrome, recurrent stenosis and/or disc herniation, vascular injury, development of instability, neck/back pain, need for further surgery, persistent symptoms, development of deformity, and the risks of anesthesia. The patient understood these risks and agreed to proceed.  Operative Note:   1. L2-3 lumbar decompression including central laminectomy and bilateral medial facetectomies including foraminotomies  The patient was then brought from the preoperative center with intravenous access established.  The patient underwent general anesthesia and endotracheal tube intubation, and was then rotated on the [place] rail top where all pressure points were appropriately padded.  The skin was then thoroughly cleansed.  Perioperative antibiotic prophylaxis was administered.  Sterile prep and drapes were then applied and a timeout was then observed.  C-arm was brought into the field under sterile conditions and under lateral visualization the L2-3 interspace was identified and marked.  The incision was marked on the right and injected with local anesthetic. Once this was complete a 3 cm incision was opened with the use of a #10 blade knife.    The metrx tubes were sequentially advanced and confirmed in  position at L2-3. An 18mm by 50mm tube was locked in place to the bed side attachment.  The microscope was then sterilely brought into the field and muscle creep was hemostased with a bipolar and resected with a pituitary rongeur.  A Bovie extender was then used to expose the spinous process and lamina.  Careful attention was placed to not violate the facet capsule. A 3 mm matchstick drill bit was then used to make a hemi-laminotomy trough until the ligamentum flavum was exposed.  This was extended to the base of the spinous process and to the contralateral side to remove all the central bone from each side.  Once this was complete and the underlying ligamentum flavum was visualized, it was dissected with a curette and resected with Kerrison rongeurs.  Extensive ligamentum hypertrophy was noted, requiring a substantial amount of time and care for removal.  The dura was identified and palpated. The kerrison rongeur was then used to remove the medial facet bilaterally until no compression was noted.  A balltip probe was used to confirm decompression of the ipsilateral L3 nerve root.  Additional attention was paid to completion of the contralateral L2-3 foraminotomy until the contralateral traversing nerve root was completely free.  Once this was complete, L2-3 central decompression including medial facetectomy and foraminotomy was confirmed and decompression on both sides was confirmed. No CSF leak was noted.  The wound was copiously irrigated. The tube system was then removed under microscopic visualization and hemostasis was obtained with a bipolar.    The fascial layer was reapproximated with the use of a 0 Vicryl suture.  Subcutaneous tissue layer was reapproximated using 2-0 Vicryl suture.  3-0 monocryl was placed in subcuticular fashion. The skin was then cleansed and Dermabond was used to close the skin opening.  Patient was then rotated back to the preoperative bed awakened from anesthesia  and taken to  recovery all counts are correct in this case.  I performed the entire procedure with the assistance of Edsel Goods PA as an designer, television/film set. An assistant was required for this procedure due to the complexity.  The assistant provided assistance in tissue manipulation and suction, and was required for the successful and safe performance of the procedure. I performed the critical portions of the procedure.   Nashaly Dorantes K. Clois MD

## 2024-03-23 NOTE — Interval H&P Note (Signed)
 History and Physical Interval Note:  03/23/2024 6:57 AM  Ray Saunders  has presented today for surgery, with the diagnosis of M48.062 Neurogenic claudication due to lumbar spinal stenosis.  The various methods of treatment have been discussed with the patient and family. After consideration of risks, benefits and other options for treatment, the patient has consented to  Procedures with comments: LUMBAR LAMINECTOMY/DECOMPRESSION MICRODISCECTOMY 1 LEVEL (N/A) - L2/3 DECOMPRESSION as a surgical intervention.  The patient's history has been reviewed, patient examined, no change in status, stable for surgery.  I have reviewed the patient's chart and labs.  Questions were answered to the patient's satisfaction.    Heart sounds normal no MRG. Chest Clear to Auscultation Bilaterally.  Tris Howell

## 2024-03-23 NOTE — Discharge Instructions (Addendum)
 " Your surgeon has performed an operation on your lumbar spine (low back) to relieve pressure on one or more nerves. Many times, patients feel better immediately after surgery and can overdo it. Even if you feel well, it is important that you follow these activity guidelines. If you do not let your back heal properly from the surgery, you can increase the chance of a disc herniation and/or return of your symptoms. The following are instructions to help in your recovery once you have been discharged from the hospital.  * It is ok to take NSAIDs after surgery. *Regarding compression stockings-  Please wear day and night until you are walking a couple hundred feet three times a day.   Activity    No bending, lifting, or twisting (BLT). Avoid lifting objects heavier than 10 pounds (gallon milk jug).  Where possible, avoid household activities that involve lifting, bending, pushing, or pulling such as laundry, vacuuming, grocery shopping, and childcare. Try to arrange for help from friends and family for these activities while your back heals.  Increase physical activity slowly as tolerated.  Taking short walks is encouraged, but avoid strenuous exercise. Do not jog, run, bicycle, lift weights, or participate in any other exercises unless specifically allowed by your doctor. Avoid prolonged sitting, including car rides.  Talk to your doctor before resuming sexual activity.  You should not drive until cleared by your doctor.  Until released by your doctor, you should not return to work or school.  You should rest at home and let your body heal.   You may shower three days after your surgery.  After showering, lightly dab your incision dry. Do not take a tub bath or go swimming for 3 weeks, or until approved by your doctor at your follow-up appointment.  If you smoke, we strongly recommend that you quit.  Smoking has been proven to interfere with normal healing in your back and will dramatically  reduce the success rate of your surgery. Please contact [facility] (800-QUIT-NOW) and use the resources at www.[facility].com for assistance in stopping smoking.  Surgical Incision   If you have a dressing on your incision, you may remove it three days after your surgery. Keep your incision area clean and dry.  Your incision was closed with Dermabond glue. The steri-strips/glue should begin to peel away within about a week (it is fine if the steri-strips fall off before then). If the strips are still in place one week after your surgery, you may gently remove them.  Diet            You may return to your usual diet. Be sure to stay hydrated.  When to Contact Us   Although your surgery and recovery will likely be uneventful, you may have some residual numbness, aches, and pains in your back and/or legs. This is normal and should improve in the next few weeks.  However, should you experience any of the following, contact us  immediately: New numbness or weakness Pain that is progressively getting worse, and is not relieved by your pain medications or rest Bleeding, redness, swelling, pain, or drainage from surgical incision Chills or flu-like symptoms Fever greater than 101.0 F (38.3 C) Problems with bowel or bladder functions Difficulty breathing or shortness of breath Warmth, tenderness, or swelling in your calf  Contact Information How to contact us :  If you have any questions/concerns before or after surgery, you can reach us  at [phone], or you can send a mychart message. We can be reached  by phone or mychart 8am-4pm, Monday-Friday.  *Please note: Calls after 4pm are forwarded to a third party answering service. Mychart messages are not routinely monitored during evenings, weekends, and holidays. Please call our office to contact the answering service for urgent concerns during non-business hours.  "

## 2024-03-23 NOTE — Anesthesia Postprocedure Evaluation (Signed)
"   Anesthesia Post Note  Patient: Ray Saunders  Procedure(s) Performed: LUMBAR LAMINECTOMY/DECOMPRESSION MICRODISCECTOMY 1 LEVEL  Patient location during evaluation: PACU Anesthesia Type: General Level of consciousness: awake and alert and oriented Pain management: pain level controlled Vital Signs Assessment: post-procedure vital signs reviewed and stable Respiratory status: spontaneous breathing Cardiovascular status: stable Postop Assessment: no apparent nausea or vomiting Anesthetic complications: no Comments: Patient BP slightly elevated but stable not requiring intervention    No notable events documented.   Last Vitals:  Vitals:   03/23/24 1015 03/23/24 1023  BP: 133/80 (!) 142/88  Pulse: 89 91  Resp: 15 (!) 23  Temp: 36.7 C   SpO2: 97% 97%    Last Pain:  Vitals:   03/23/24 1023  TempSrc:   PainSc: 0-No pain                 Ambrosio Reuter GORMAN Balloon      "

## 2024-03-23 NOTE — Anesthesia Preprocedure Evaluation (Addendum)
"                                    Anesthesia Evaluation   Patient awake    Reviewed: Allergy & Precautions, NPO status , Patient's Chart, lab work & pertinent test results, reviewed documented beta blocker date and time   Airway Mallampati: III  TM Distance: >3 FB     Dental no notable dental hx. (+) Missing   Pulmonary shortness of breath and with exertion, former smoker   Pulmonary exam normal        Cardiovascular hypertension, Pt. on medications Normal cardiovascular exam  HLD  No CP    Neuro/Psych  PSYCHIATRIC DISORDERS         GI/Hepatic ,GERD  ,,  Endo/Other    Renal/GU Increase PSA     Musculoskeletal   Abdominal Normal abdominal exam  (+)   Peds  Hematology   Anesthesia Other Findings Hx of EtOH abuse WBC  RBC  Hematocrit  Hemoglobin  Plt  MCV  10.8 High  03/16/24 1052 5.38 03/16/24 1052 47.2 03/16/24 1052 16.1 03/16/24 1052 275 03/16/24 1052 87.7 03/16/24 1052 138 03/16/24 1052 3.5 03/16/24 1052 10.0 03/16/24 1052 103 03/16/24 1052 12 03/16/24 1052 0.85 03/16/24 1052 108 High  03/16/24 1052    Reproductive/Obstetrics                              Anesthesia Physical Anesthesia Plan  ASA: 2  Anesthesia Plan: General   Post-op Pain Management:    Induction: Intravenous  PONV Risk Score and Plan:   Airway Management Planned: Oral ETT  Additional Equipment:   Intra-op Plan:   Post-operative Plan: Extubation in OR and Possible Post-op intubation/ventilation  Informed Consent: I have reviewed the patients History and Physical, chart, labs and discussed the procedure including the risks, benefits and alternatives for the proposed anesthesia with the patient or authorized representative who has indicated his/her understanding and acceptance.     Dental advisory given  Plan Discussed with: CRNA  Anesthesia Plan Comments: (Patient missing teeth Hx of smoking +SOB post op ventilation possible)         Anesthesia Quick Evaluation  "

## 2024-03-24 ENCOUNTER — Telehealth: Payer: Self-pay | Admitting: Neurosurgery

## 2024-03-24 ENCOUNTER — Encounter: Payer: Self-pay | Admitting: Neurosurgery

## 2024-03-24 NOTE — Telephone Encounter (Signed)
 DOS: 03/23/24 L2/3 DECOMPRESSION with Dr. Clois  Returned patient call regarding pain medication regimen. Patient was asking if he could resume taking ibuprofen . Patient states he was only taking oxycodone  Q4H and one zanaflex  last night. Encouraged patient to start taking tylenol  1000mg  Q6-8H and Ibuprofen  600mg  Q6H. Additionally informed him that he can take zanaflex  Q6H.   Advised patient that he does not have any restrictions for positioning and encouraged physical activity as tolerated.   Patient to call back if he experiences any challenges with pain management after addig over the counter medications.

## 2024-03-24 NOTE — Telephone Encounter (Signed)
 Pt is wanting to know if he can take ibuprofen  at this time, and if its better for him to lay instead of sitting down when resting. He was in a lot of pain when he sat up.

## 2024-03-27 ENCOUNTER — Telehealth: Admitting: Family

## 2024-03-27 DIAGNOSIS — M25552 Pain in left hip: Secondary | ICD-10-CM

## 2024-03-27 DIAGNOSIS — M25551 Pain in right hip: Secondary | ICD-10-CM

## 2024-03-27 DIAGNOSIS — Z9889 Other specified postprocedural states: Secondary | ICD-10-CM | POA: Diagnosis not present

## 2024-03-27 NOTE — Progress Notes (Signed)
 " Virtual Visit Consent   Ray Saunders, you are scheduled for a virtual visit with a [facility] provider today. Just as with appointments in the office, your consent must be obtained to participate. Your consent will be active for this visit and any virtual visit you may have with one of our providers in the next 365 days. If you have a MyChart account, a copy of this consent can be sent to you electronically.  As this is a virtual visit, video technology does not allow for your provider to perform a traditional examination. This may limit your provider's ability to fully assess your condition. If your provider identifies any concerns that need to be evaluated in person or the need to arrange testing (such as labs, EKG, etc.), we will make arrangements to do so. Although advances in technology are sophisticated, we cannot ensure that it will always work on either your end or our end. If the connection with a video visit is poor, the visit may have to be switched to a telephone visit. With either a video or telephone visit, we are not always able to ensure that we have a secure connection.  By engaging in this virtual visit, you consent to the provision of healthcare and authorize for your insurance to be billed (if applicable) for the services provided during this visit. Depending on your insurance coverage, you may receive a charge related to this service.  I need to obtain your verbal consent now. Are you willing to proceed with your visit today? Ray Saunders has provided verbal consent on 03/27/2024 for a virtual visit (video or telephone). Bari Learn, FNP  Date: 03/27/2024 12:31 PM   Virtual Visit via Video Note   I, Bari Learn, connected with  Ray Saunders  ([id number], 02/06/1961) on 03/27/24 at 12:30 PM EST by a video-enabled telemedicine application and verified that I am speaking with the correct person using two identifiers.  Location: Patient: Virtual Visit Location Patient:  Home Provider: Virtual Visit Location Provider: Home Office   I discussed the limitations of evaluation and management by telemedicine and the availability of in person appointments. The patient expressed understanding and agreed to proceed.    History of Present Illness: Ray Saunders is a 64 y.o. who identifies as a male who was assigned male at birth, and is being seen today for bilateral hip pain that started yesterday. He had lumbar laminectomy and decompression on 03/23/24. He was given oxycodone  5 mg that has not helped. He has also been taking zanaflex  4 mg every 6 hours and motrin  600 mg without relief.   He has gabapentin , but has not been taking these.    He reports mild aching pain of 5-6 out, but his pain in his hips are a 10 out 10.  HPI: Hip Pain  The incident occurred 2 days ago. There was no injury mechanism. The quality of the pain is described as aching. The pain is at a severity of 10/10. The pain is moderate. The pain has been Intermittent since onset. Pertinent negatives include no loss of motion, loss of sensation, numbness or tingling. He reports no foreign bodies present. The symptoms are aggravated by weight bearing and movement. He has tried acetaminophen  for the symptoms. The treatment provided mild relief.    Problems:  Patient Active Problem List   Diagnosis Date Noted   Neurogenic claudication due to lumbar spinal stenosis 03/23/2024   Prostate cancer (HCC) 07/07/2018   Chest pain at  rest 07/07/2017   Dyspnea on exertion 07/07/2017   Rising PSA level 10/17/2014   BP (high blood pressure) 08/24/2014   Abnormal prostate specific antigen 09/05/2013   HLD (hyperlipidemia) 09/05/2013    Allergies: Allergies[1] Medications: Current Medications[2]  Observations/Objective: Patient is well-developed, well-nourished in no acute distress.  Resting comfortably  at home.  Head is normocephalic, atraumatic.  No labored breathing.  Speech is clear and coherent  with logical content.  Patient is alert and oriented at baseline.  Pain with standing, able to move hips   Assessment and Plan: 1. Bilateral hip pain (Primary)  2. History of lumbar laminectomy for spinal cord decompression  Given pain recommend calling surgeon Continue Oxycodone , ibuprofen , and Zanaflex  Recommend starting gabapentin  as needed Recommend follow up in person for evaluation given pain is a 10 out 10   Follow Up Instructions: I discussed the assessment and treatment plan with the patient. The patient was provided an opportunity to ask questions and all were answered. The patient agreed with the plan and demonstrated an understanding of the instructions.  A copy of instructions were sent to the patient via MyChart unless otherwise noted below.     The patient was advised to call back or seek an in-person evaluation if the symptoms worsen or if the condition fails to improve as anticipated.    Bari Learn, FNP    [1] No Known Allergies [2]  Current Outpatient Medications:    amLODipine (NORVASC) 5 MG tablet, Take 5 mg by mouth at bedtime., Disp: , Rfl:    fluticasone  (FLONASE ) 50 MCG/ACT nasal spray, Place 1 spray into both nostrils daily as needed for allergies or rhinitis., Disp: , Rfl:    gabapentin  (NEURONTIN ) 600 MG tablet, Take 1 tablet (600 mg total) by mouth 3 (three) times daily. (Patient taking differently: Take 300 mg by mouth 3 (three) times daily.), Disp: 90 tablet, Rfl: 0   ibuprofen  (ADVIL ) 200 MG tablet, Take 200 mg by mouth every 4 (four) hours as needed., Disp: , Rfl:    ibuprofen  (ADVIL ) 600 MG tablet, Take 1 tablet (600 mg total) by mouth every 6 (six) hours as needed. (Patient taking differently: Take 600 mg by mouth daily.), Disp: 20 tablet, Rfl: 0   lisinopril-hydrochlorothiazide (PRINZIDE,ZESTORETIC) 20-12.5 MG tablet, Take 1 tablet by mouth in the morning., Disp: , Rfl: 2   mupirocin  ointment (BACTROBAN ) 2 %, Apply 1 Application topically 2  (two) times daily. (Patient not taking: Reported on 03/11/2024), Disp: 22 g, Rfl: 0   oxyCODONE  (ROXICODONE ) 5 MG immediate release tablet, Take 1 tablet (5 mg total) by mouth every 4 (four) hours as needed for up to 5 days., Disp: 30 tablet, Rfl: 0   polyethylene glycol powder (MIRALAX ) 17 GM/SCOOP powder, Dissolve 1 capful (17g) in 4-8 ounces of liquid and take by mouth daily as needed for constipation., Disp: 238 g, Rfl: 0   rosuvastatin (CRESTOR) 5 MG tablet, Take 5 mg by mouth at bedtime., Disp: , Rfl:    senna (SENOKOT) 8.6 MG TABS tablet, Take 1 tablet (8.6 mg total) by mouth 2 (two) times daily as needed for mild constipation., Disp: 30 tablet, Rfl: 0   tiZANidine  (ZANAFLEX ) 4 MG tablet, Take 1 tablet (4 mg total) by mouth every 6 (six) hours as needed for muscle spasms., Disp: 120 tablet, Rfl: 0  "

## 2024-03-28 ENCOUNTER — Telehealth: Payer: Self-pay

## 2024-03-28 ENCOUNTER — Other Ambulatory Visit: Payer: Self-pay | Admitting: Neurosurgery

## 2024-03-28 MED ORDER — OXYCODONE HCL 5 MG PO TABS: 5.0000 mg | ORAL_TABLET | ORAL | 0 refills | Status: AC | PRN

## 2024-03-28 MED ORDER — GABAPENTIN 600 MG PO TABS: 600.0000 mg | ORAL_TABLET | Freq: Three times a day (TID) | ORAL | 0 refills | Status: AC

## 2024-03-28 NOTE — Telephone Encounter (Signed)
 Per review of [facility] video visit completed 03/27/24, He was given oxycodone  5 mg that has not helped. He has also been taking zanaflex  4 mg every 6 hours and motrin  600 mg without relief. He has gabapentin , but has not been taking these.   Per review of telephone call with our office on 03/24/24 with Kathrine C, RN: Patient was asking if he could resume taking ibuprofen . Patient states he was only taking oxycodone  Q4H and one zanaflex  last night. Encouraged patient to start taking tylenol  1000mg  Q6-8H and Ibuprofen  600mg  Q6H. Additionally informed him that he can take zanaflex  Q6H.

## 2024-03-28 NOTE — Telephone Encounter (Signed)
 I spoke with Mr Klaiber  He reports bilateral hip pain in the sockets/joints. It only hurts when he stands/walks and goes away within a couple of minutes of sitting/laying down.  His hips are not tender to touch and the pain has only radiated to his groin a couple of times on Saturday and Sunday.   This feels new/different than the pain he had prior to surgery.   His back pain is not as bothersome as it was prior to surgery. He has had muscle spasms going down the back of both thighs. He feels his leg pain that was present prior to surgery might not be as bothersome, but it is hard to say because his hips hurt so much.  He denies a history of hip problems.   We reviewed pertinent medications:  He only about 4 gabapentin  left. He ran out of 600mg  tablets, but he has been taking 2 of the 300mg  capsules three times a day. He is requesting a refill of gabapentin .  He has been taking OTC ibuprofen  600mg  about 3 times per day  He has been taking oxycodone  5mg  every 4 hours - has enough to last until maybe tomorrow. He is requesting a refill and an increase of this, as he doesn't feel it is helping.  He has been taking tizanidine  4mg  - only at night because it makes him sleepy.  He is not taking a muscle relaxer during the day. He tried flexeril in September - does not think it helped  He has not been taking taking tylenol   Last round of steroids was prescribed 03/01/24; he confirmed he tolerated this without issue (it was prescribed with antibiotics for a sinus infection).    [facility] in [place]

## 2024-03-28 NOTE — Telephone Encounter (Signed)
 Ray Saunders

## 2024-03-28 NOTE — Telephone Encounter (Signed)
 Attempted to reach patient - no answer. Left message that I will send him a mychart with our recommendations.

## 2024-03-31 NOTE — Progress Notes (Unsigned)
" ° °  REFERRING PHYSICIAN:  Jeffie Cheryl BRAVO, Md [address] [place],  [place] [address]  DOS: 03/23/24 L2-L3 lumbar decompression  HISTORY OF PRESENT ILLNESS: Ray Saunders is approximately 2 weeks status post above surgery. Was given oxycodone  and zanaflex  on discharge from the hospital.   He called with increased pain on 03/28/24 and was to add tylenol  in addition to zanaflex . He was also to increase oxycodone  to 1-2 q 4 hours prn and continue on neurontin .   Preop back/tailbone pain. ***       PHYSICAL EXAMINATION:  General: Patient is well developed, well nourished, calm, collected, and in no apparent distress.   NEUROLOGICAL:  General: In no acute distress.   Awake, alert, oriented to person, place, and time.  Pupils equal round and reactive to light.  Facial tone is symmetric.     Strength:            Side Iliopsoas Quads Hamstring PF DF EHL  R 5 5 5 5 5 5   L 5 5 5 5 5 5    Incision c/d/i   ROS (Neurologic):  Negative except as noted above  IMAGING: Nothing new to review.   ASSESSMENT/PLAN:  Ray Saunders is doing well s/p above surgery. Treatment options reviewed with patient and following plan made:   - I have advised the patient to lift up to 10 pounds until 6 weeks after surgery (follow up with Dr. Clois).  - Reviewed wound care.  - No bending, twisting, or lifting.  - Continue on current medications including ***.  - Follow up as scheduled in 4 weeks and prn.   Advised to contact the office if any questions or concerns arise.  Glade Boys PA-C Department of neurosurgery "

## 2024-04-05 ENCOUNTER — Encounter: Admitting: Orthopedic Surgery

## 2024-04-05 DIAGNOSIS — Z9889 Other specified postprocedural states: Secondary | ICD-10-CM

## 2024-04-05 DIAGNOSIS — M48062 Spinal stenosis, lumbar region with neurogenic claudication: Secondary | ICD-10-CM

## 2024-05-03 ENCOUNTER — Encounter: Admitting: Neurosurgery

## 2024-06-14 ENCOUNTER — Encounter: Admitting: Orthopedic Surgery

## 2024-06-17 ENCOUNTER — Encounter (INDEPENDENT_AMBULATORY_CARE_PROVIDER_SITE_OTHER)

## 2024-06-17 ENCOUNTER — Ambulatory Visit (INDEPENDENT_AMBULATORY_CARE_PROVIDER_SITE_OTHER): Admitting: Vascular Surgery
